# Patient Record
Sex: Female | Born: 1985 | Race: Black or African American | Hispanic: No | Marital: Single | Smoking: Former smoker
Health system: Southern US, Community
[De-identification: ages and names within clinical notes are randomized; demographics above are authoritative.]

## PROBLEM LIST (undated history)

## (undated) ENCOUNTER — Inpatient Hospital Stay (HOSPITAL_COMMUNITY): Payer: Self-pay

## (undated) DIAGNOSIS — K219 Gastro-esophageal reflux disease without esophagitis: Secondary | ICD-10-CM

## (undated) DIAGNOSIS — R87629 Unspecified abnormal cytological findings in specimens from vagina: Secondary | ICD-10-CM

## (undated) DIAGNOSIS — A5901 Trichomonal vulvovaginitis: Secondary | ICD-10-CM

## (undated) HISTORY — PX: WISDOM TOOTH EXTRACTION: SHX21

## (undated) HISTORY — PX: HERNIA REPAIR: SHX51

---

## 2012-04-04 DIAGNOSIS — A5901 Trichomonal vulvovaginitis: Secondary | ICD-10-CM

## 2012-04-04 HISTORY — DX: Trichomonal vulvovaginitis: A59.01

## 2013-12-31 ENCOUNTER — Inpatient Hospital Stay (HOSPITAL_COMMUNITY)
Admission: AD | Admit: 2013-12-31 | Discharge: 2013-12-31 | Disposition: A | Discharge status: Home / Self Care | Payer: Medicaid Other | Source: Ambulatory Visit | Attending: Family Medicine | Admitting: Family Medicine

## 2013-12-31 ENCOUNTER — Encounter (HOSPITAL_COMMUNITY): Payer: Self-pay | Admitting: *Deleted

## 2013-12-31 ENCOUNTER — Inpatient Hospital Stay (HOSPITAL_COMMUNITY): Payer: Self-pay

## 2013-12-31 DIAGNOSIS — O2390 Unspecified genitourinary tract infection in pregnancy, unspecified trimester: Secondary | ICD-10-CM | POA: Insufficient documentation

## 2013-12-31 DIAGNOSIS — O26899 Other specified pregnancy related conditions, unspecified trimester: Secondary | ICD-10-CM

## 2013-12-31 DIAGNOSIS — B373 Candidiasis of vulva and vagina: Secondary | ICD-10-CM

## 2013-12-31 DIAGNOSIS — R109 Unspecified abdominal pain: Secondary | ICD-10-CM | POA: Insufficient documentation

## 2013-12-31 DIAGNOSIS — B3731 Acute candidiasis of vulva and vagina: Secondary | ICD-10-CM

## 2013-12-31 DIAGNOSIS — Z87891 Personal history of nicotine dependence: Secondary | ICD-10-CM | POA: Insufficient documentation

## 2013-12-31 HISTORY — DX: Trichomonal vulvovaginitis: A59.01

## 2013-12-31 HISTORY — DX: Gastro-esophageal reflux disease without esophagitis: K21.9

## 2013-12-31 HISTORY — DX: Unspecified abnormal cytological findings in specimens from vagina: R87.629

## 2013-12-31 LAB — URINALYSIS, ROUTINE W REFLEX MICROSCOPIC
BILIRUBIN URINE: NEGATIVE
Glucose, UA: NEGATIVE mg/dL
Hgb urine dipstick: NEGATIVE
KETONES UR: NEGATIVE mg/dL
NITRITE: NEGATIVE
Protein, ur: NEGATIVE mg/dL
Specific Gravity, Urine: 1.01 (ref 1.005–1.030)
UROBILINOGEN UA: 0.2 mg/dL (ref 0.0–1.0)
pH: 6.5 (ref 5.0–8.0)

## 2013-12-31 LAB — CBC
HCT: 33.4 % — ABNORMAL LOW (ref 36.0–46.0)
Hemoglobin: 11.5 g/dL — ABNORMAL LOW (ref 12.0–15.0)
MCH: 29.1 pg (ref 26.0–34.0)
MCHC: 34.4 g/dL (ref 30.0–36.0)
MCV: 84.6 fL (ref 78.0–100.0)
PLATELETS: 201 10*3/uL (ref 150–400)
RBC: 3.95 MIL/uL (ref 3.87–5.11)
RDW: 13.6 % (ref 11.5–15.5)
WBC: 5.8 10*3/uL (ref 4.0–10.5)

## 2013-12-31 LAB — WET PREP, GENITAL
Trich, Wet Prep: NONE SEEN
Yeast Wet Prep HPF POC: NONE SEEN

## 2013-12-31 LAB — HIV ANTIBODY (ROUTINE TESTING W REFLEX): HIV: NONREACTIVE

## 2013-12-31 LAB — URINE MICROSCOPIC-ADD ON

## 2013-12-31 LAB — POCT PREGNANCY, URINE: Preg Test, Ur: POSITIVE — AB

## 2013-12-31 LAB — HCG, QUANTITATIVE, PREGNANCY: HCG, BETA CHAIN, QUANT, S: 76261 m[IU]/mL — AB (ref <5)

## 2013-12-31 MED ORDER — TERCONAZOLE 0.4 % VA CREA: 1.0000 | TOPICAL_CREAM | Freq: Every day | VAGINAL | Status: DC

## 2013-12-31 NOTE — MAU Provider Note (Signed)
Chief Complaint: Abdominal Pain   First Provider Initiated Contact with Patient 12/31/13 0224     SUBJECTIVE HPI: Bailey Martin is a 28 y.o. G3P1010 at [redacted]w[redacted]d by LMP who presents to maternity admissions reporting pain in the middle lower part of her abdomen radiating down into her vagina x 1 week.  She also reports vaginal discharge with itching x 1 week.  She is unsure of any exposure to STD as she was separated from her boyfriend for a few weeks but they got back together at the end of August.  She denies LOF, vaginal bleeding, urinary symptoms, h/a, dizziness, n/v, or fever/chills.     Past Medical History  Diagnosis Date  . Vaginal Pap smear, abnormal   . Trichomonal vaginitis 2014  . GERD (gastroesophageal reflux disease)    Past Surgical History  Procedure Laterality Date  . Hernia repair    . Wisdom tooth extraction     History   Social History  . Marital Status: Single    Spouse Name: N/A    Number of Children: N/A  . Years of Education: N/A   Occupational History  . Not on file.   Social History Main Topics  . Smoking status: Former Research scientist (life sciences)  . Smokeless tobacco: Never Used  . Alcohol Use: Yes     Comment: occassional when not pregnant  . Drug Use: No  . Sexual Activity: Yes    Birth Control/ Protection: None   Other Topics Concern  . Not on file   Social History Narrative  . No narrative on file   No current facility-administered medications on file prior to encounter.   No current outpatient prescriptions on file prior to encounter.   Allergies  Allergen Reactions  . Penicillins     Pt states that she has been allergic since childhood but does not know what the reaction is    ROS: Pertinent items in HPI  OBJECTIVE Blood pressure 105/70, pulse 72, temperature 99 F (37.2 C), temperature source Oral, resp. rate 18, height 5\' 8"  (1.727 m), weight 63.957 kg (141 lb), last menstrual period 11/08/2013, SpO2 100.00%. GENERAL: Well-developed,  well-nourished female in no acute distress.  HEENT: Normocephalic HEART: normal rate RESP: normal effort ABDOMEN: Soft, non-tender EXTREMITIES: Nontender, no edema NEURO: Alert and oriented Pelvic exam: Cervix pink, visually closed, without lesion, small amount thick white discharge, vaginal walls and external genitalia normal Bimanual exam: Cervix 0/long/high, firm, anterior, positive mild CMT, uterus nontender, 7 week size, adnexa without tenderness, enlargement, or mass  LAB RESULTS Results for orders placed during the hospital encounter of 12/31/13 (from the past 24 hour(s))  URINALYSIS, ROUTINE W REFLEX MICROSCOPIC     Status: Abnormal   Collection Time    12/31/13 12:33 AM      Result Value Ref Range   Color, Urine YELLOW  YELLOW   APPearance CLEAR  CLEAR   Specific Gravity, Urine 1.010  1.005 - 1.030   pH 6.5  5.0 - 8.0   Glucose, UA NEGATIVE  NEGATIVE mg/dL   Hgb urine dipstick NEGATIVE  NEGATIVE   Bilirubin Urine NEGATIVE  NEGATIVE   Ketones, ur NEGATIVE  NEGATIVE mg/dL   Protein, ur NEGATIVE  NEGATIVE mg/dL   Urobilinogen, UA 0.2  0.0 - 1.0 mg/dL   Nitrite NEGATIVE  NEGATIVE   Leukocytes, UA SMALL (*) NEGATIVE  URINE MICROSCOPIC-ADD ON     Status: None   Collection Time    12/31/13 12:33 AM      Result Value  Ref Range   Squamous Epithelial / LPF RARE  RARE   WBC, UA 3-6  <3 WBC/hpf   RBC / HPF 0-2  <3 RBC/hpf   Bacteria, UA RARE  RARE  CBC     Status: Abnormal   Collection Time    12/31/13  1:04 AM      Result Value Ref Range   WBC 5.8  4.0 - 10.5 K/uL   RBC 3.95  3.87 - 5.11 MIL/uL   Hemoglobin 11.5 (*) 12.0 - 15.0 g/dL   HCT 33.4 (*) 36.0 - 46.0 %   MCV 84.6  78.0 - 100.0 fL   MCH 29.1  26.0 - 34.0 pg   MCHC 34.4  30.0 - 36.0 g/dL   RDW 13.6  11.5 - 15.5 %   Platelets 201  150 - 400 K/uL  HCG, QUANTITATIVE, PREGNANCY     Status: Abnormal   Collection Time    12/31/13  1:04 AM      Result Value Ref Range   hCG, Beta Chain, [place], [place] [address] (*) <5 mIU/mL   POCT PREGNANCY, URINE     Status: Abnormal   Collection Time    12/31/13  1:36 AM      Result Value Ref Range   Preg Test, Ur POSITIVE (*) NEGATIVE  WET PREP, GENITAL     Status: Abnormal   Collection Time    12/31/13  2:40 AM      Result Value Ref Range   Yeast Wet Prep HPF POC NONE SEEN  NONE SEEN   Trich, Wet Prep NONE SEEN  NONE SEEN   Clue Cells Wet Prep HPF POC FEW (*) NONE SEEN   WBC, Wet Prep HPF POC FEW (*) NONE SEEN    IMAGING US Ob Comp Less 14 Wks  12/31/2013   CLINICAL DATA:  Abdominal pain for a few days. Estimated gestational age by LMP is 7 weeks 4 days. Quantitative beta HCG is 76,261.  EXAM: OBSTETRIC <14 WK [place] AND TRANSVAGINAL OB US  TECHNIQUE: Both transabdominal and transvaginal ultrasound examinations were performed for complete evaluation of the gestation as well as the maternal uterus, adnexal regions, and pelvic cul-de-sac. Transvaginal technique was performed to assess early pregnancy.  COMPARISON:  None.  FINDINGS: Intrauterine gestational sac: A single intrauterine pregnancy is demonstrated.  Yolk sac:  Yolk sac is present.  Embryo:  Fetal pole is identified.  Cardiac Activity: Fetal cardiac activity is identified.  Heart Rate:  171 bpm  CRL:   18.6  mm   8 w 3 d                  [place] EDC: 08/09/2014  Maternal uterus/adnexae: The uterus is anteverted. No myometrial masses. No subchorionic hemorrhage. Both ovaries are visualized and are normal in appearance. No abnormal adnexal masses. Minimal free pelvic fluid collections.  IMPRESSION: Single intrauterine pregnancy. Estimated gestational age by crown-rump length is 8 weeks 3 days. No acute complication is suggested.   Electronically Signed   By: Lucienne Capers M.D.   On: 12/31/2013 03:31   US Ob Transvaginal  12/31/2013   CLINICAL DATA:  Abdominal pain for a few days. Estimated gestational age by LMP is 7 weeks 4 days. Quantitative beta HCG is 76,261.  EXAM: OBSTETRIC <14 WK [place] AND TRANSVAGINAL OB US  TECHNIQUE: Both  transabdominal and transvaginal ultrasound examinations were performed for complete evaluation of the gestation as well as the maternal uterus, adnexal regions, and pelvic cul-de-sac. Transvaginal technique  was performed to assess early pregnancy.  COMPARISON:  None.  FINDINGS: Intrauterine gestational sac: A single intrauterine pregnancy is demonstrated.  Yolk sac:  Yolk sac is present.  Embryo:  Fetal pole is identified.  Cardiac Activity: Fetal cardiac activity is identified.  Heart Rate:  171 bpm  CRL:   18.6  mm   8 w 3 d                  [place] EDC: 08/09/2014  Maternal uterus/adnexae: The uterus is anteverted. No myometrial masses. No subchorionic hemorrhage. Both ovaries are visualized and are normal in appearance. No abnormal adnexal masses. Minimal free pelvic fluid collections.  IMPRESSION: Single intrauterine pregnancy. Estimated gestational age by crown-rump length is 8 weeks 3 days. No acute complication is suggested.   Electronically Signed   By: Lucienne Capers M.D.   On: 12/31/2013 03:31    ASSESSMENT 1. Abdominal pain in pregnancy   2. Yeast vaginitis     PLAN Discharge home Terazol 7 for yeast vaginitis symptoms F/u with early prenatal care    Medication List         terconazole 0.4 % vaginal cream  Commonly known as:  TERAZOL 7  Place 1 applicator vaginally at bedtime.       Follow-up Information   Please follow up. (With prenatal provider of your choice)       Follow up with [facility]. (As needed for emergencies)    Contact information:   [address] [id number] [place] [id number] [phone]      Fatima Blank Certified Nurse-Midwife 12/31/2013  4:18 AM

## 2013-12-31 NOTE — Discharge Instructions (Signed)

## 2013-12-31 NOTE — MAU Note (Signed)
Pt reports abd cramping for the last few days, positive preg test one week ago. Also thinks she may have a yeast infection.

## 2014-01-01 LAB — GC/CHLAMYDIA PROBE AMP
CT Probe RNA: NEGATIVE
GC PROBE AMP APTIMA: NEGATIVE

## 2014-01-01 LAB — URINE CULTURE
CULTURE: NO GROWTH
Colony Count: NO GROWTH

## 2014-01-01 NOTE — MAU Provider Note (Signed)
Attestation of Attending Supervision of Advanced Practitioner (PA/CNM/NP): Evaluation and management procedures were performed by the Advanced Practitioner under my supervision and collaboration.  I have reviewed the Advanced Practitioner's note and chart, and I agree with the management and plan.  Gelisa Tieken, DO Attending Physician Faculty Practice, [facility]  

## 2014-02-04 ENCOUNTER — Encounter (HOSPITAL_COMMUNITY): Payer: Self-pay | Admitting: *Deleted

## 2014-02-26 ENCOUNTER — Other Ambulatory Visit (HOSPITAL_COMMUNITY): Payer: Self-pay | Admitting: Urology

## 2014-02-26 DIAGNOSIS — Z3689 Encounter for other specified antenatal screening: Secondary | ICD-10-CM

## 2014-02-26 LAB — OB RESULTS CONSOLE ABO/RH: RH Type: POSITIVE

## 2014-02-26 LAB — OB RESULTS CONSOLE GC/CHLAMYDIA
CHLAMYDIA, DNA PROBE: NEGATIVE
Gonorrhea: NEGATIVE

## 2014-02-26 LAB — OB RESULTS CONSOLE HEPATITIS B SURFACE ANTIGEN: HEP B S AG: NEGATIVE

## 2014-02-26 LAB — OB RESULTS CONSOLE RUBELLA ANTIBODY, IGM: Rubella: IMMUNE

## 2014-02-26 LAB — OB RESULTS CONSOLE RPR: RPR: NONREACTIVE

## 2014-02-26 LAB — OB RESULTS CONSOLE ANTIBODY SCREEN: ANTIBODY SCREEN: NEGATIVE

## 2014-02-26 LAB — OB RESULTS CONSOLE HIV ANTIBODY (ROUTINE TESTING): HIV: NONREACTIVE

## 2014-03-18 ENCOUNTER — Ambulatory Visit (HOSPITAL_COMMUNITY)
Admission: RE | Admit: 2014-03-18 | Discharge: 2014-03-18 | Disposition: A | Discharge status: Home / Self Care | Payer: Medicaid Other | Source: Ambulatory Visit | Attending: Urology | Admitting: Urology

## 2014-03-18 DIAGNOSIS — Z36 Encounter for antenatal screening of mother: Secondary | ICD-10-CM | POA: Diagnosis present

## 2014-03-18 DIAGNOSIS — Z1389 Encounter for screening for other disorder: Secondary | ICD-10-CM | POA: Insufficient documentation

## 2014-03-18 DIAGNOSIS — Z3A19 19 weeks gestation of pregnancy: Secondary | ICD-10-CM | POA: Insufficient documentation

## 2014-03-18 DIAGNOSIS — Z3689 Encounter for other specified antenatal screening: Secondary | ICD-10-CM

## 2014-04-04 NOTE — L&D Delivery Note (Signed)
Delivery Note Progressed to complete dilation and AROM was performed for yellow mec fluid.  At 10:56 PM a viable and healthy female was delivered via Vaginal, Spontaneous Delivery (Presentation: OA ).  APGAR: , ; weight  .   Placenta status: spontaneous and grossly intact with 3 vessel Cord:  with the following complications: . Nuchal and body cord.  Anesthesia:  none Episiotomy:  none Lacerations:  None Suture Repair: none Est. Blood Loss (mL):    Mom to postpartum.  Baby to Couplet care / Skin to Skin.  [facility] 08/19/2014, 11:16 PM

## 2014-07-03 ENCOUNTER — Encounter: Payer: Self-pay | Admitting: *Deleted

## 2014-07-16 LAB — OB RESULTS CONSOLE GBS: STREP GROUP B AG: POSITIVE

## 2014-07-25 ENCOUNTER — Inpatient Hospital Stay (HOSPITAL_COMMUNITY)
Admission: AD | Admit: 2014-07-25 | Discharge: 2014-07-25 | Disposition: A | Discharge status: Home / Self Care | Payer: Medicaid Other | Source: Ambulatory Visit | Attending: Obstetrics and Gynecology | Admitting: Obstetrics and Gynecology

## 2014-07-25 ENCOUNTER — Encounter (HOSPITAL_COMMUNITY): Payer: Self-pay | Admitting: *Deleted

## 2014-07-25 DIAGNOSIS — O212 Late vomiting of pregnancy: Secondary | ICD-10-CM | POA: Insufficient documentation

## 2014-07-25 DIAGNOSIS — Z3A37 37 weeks gestation of pregnancy: Secondary | ICD-10-CM | POA: Diagnosis not present

## 2014-07-25 DIAGNOSIS — O26899 Other specified pregnancy related conditions, unspecified trimester: Secondary | ICD-10-CM

## 2014-07-25 DIAGNOSIS — Z87891 Personal history of nicotine dependence: Secondary | ICD-10-CM | POA: Insufficient documentation

## 2014-07-25 DIAGNOSIS — R109 Unspecified abdominal pain: Secondary | ICD-10-CM | POA: Insufficient documentation

## 2014-07-25 DIAGNOSIS — R112 Nausea with vomiting, unspecified: Secondary | ICD-10-CM

## 2014-07-25 DIAGNOSIS — O9989 Other specified diseases and conditions complicating pregnancy, childbirth and the puerperium: Secondary | ICD-10-CM | POA: Insufficient documentation

## 2014-07-25 LAB — URINALYSIS, ROUTINE W REFLEX MICROSCOPIC
Bilirubin Urine: NEGATIVE
Glucose, UA: NEGATIVE mg/dL
HGB URINE DIPSTICK: NEGATIVE
Ketones, ur: 15 mg/dL — AB
LEUKOCYTES UA: NEGATIVE
NITRITE: NEGATIVE
PROTEIN: NEGATIVE mg/dL
SPECIFIC GRAVITY, URINE: 1.02 (ref 1.005–1.030)
UROBILINOGEN UA: 0.2 mg/dL (ref 0.0–1.0)
pH: 8 (ref 5.0–8.0)

## 2014-07-25 MED ORDER — ONDANSETRON 8 MG PO TBDP
8.0000 mg | ORAL_TABLET | Freq: Once | ORAL | Status: AC
  Administered 2014-07-25: 8 mg via ORAL
  Filled 2014-07-25: qty 1

## 2014-07-25 MED ORDER — METOCLOPRAMIDE HCL 10 MG PO TABS
10.0000 mg | ORAL_TABLET | Freq: Three times a day (TID) | ORAL | Status: DC | PRN
  Administered 2014-07-25: 10 mg via ORAL
  Filled 2014-07-25: qty 1

## 2014-07-25 MED ORDER — ONDANSETRON 8 MG PO TBDP: 8.0000 mg | ORAL_TABLET | Freq: Once | ORAL | Status: DC

## 2014-07-25 MED ORDER — ONDANSETRON HCL 4 MG/2ML IJ SOLN
4.0000 mg | Freq: Once | INTRAMUSCULAR | Status: DC
  Filled 2014-07-25: qty 2

## 2014-07-25 MED ORDER — LACTATED RINGERS IV BOLUS (SEPSIS): 500.0000 mL | Freq: Once | INTRAVENOUS | Status: DC

## 2014-07-25 NOTE — Discharge Instructions (Signed)
Abdominal Pain During Pregnancy Belly (abdominal) pain is common during pregnancy. Most of the time, it is not a serious problem. Other times, it can be a sign that something is wrong with the pregnancy. Always tell your doctor if you have belly pain. HOME CARE Monitor your belly pain for any changes. The following actions may help you feel better:  Do not have sex (intercourse) or put anything in your vagina until you feel better.  Rest until your pain stops.  Drink clear fluids if you feel sick to your stomach (nauseous). Do not eat solid food until you feel better.  Only take medicine as told by your doctor.  Keep all doctor visits as told. GET HELP RIGHT AWAY IF:   You are bleeding, leaking fluid, or pieces of tissue come out of your vagina.  You have more pain or cramping.  You keep throwing up (vomiting).  You have pain when you pee (urinate) or have blood in your pee.  You have a fever.  You do not feel your baby moving as much.  You feel very weak or feel like passing out.  You have trouble breathing, with or without belly pain.  You have a very bad headache and belly pain.  You have fluid leaking from your vagina and belly pain.  You keep having watery poop (diarrhea).  Your belly pain does not go away after resting, or the pain gets worse. MAKE SURE YOU:   Understand these instructions.  Will watch your condition.  Will get help right away if you are not doing well or get worse. Document Released: 03/09/2009 Document Revised: 11/21/2012 Document Reviewed: 10/18/2012 [facility] Patient Information 2015 [place], [place]. This information is not intended to replace advice given to you by your health care provider. Make sure you discuss any questions you have with your health care provider.   Nausea and Vomiting Nausea means you feel sick to your stomach. Throwing up (vomiting) is a reflex where stomach contents come out of your mouth. HOME CARE   Take  medicine as told by your doctor.  Do not force yourself to eat. However, you do need to drink fluids.  If you feel like eating, eat a normal diet as told by your doctor.  Eat rice, wheat, potatoes, bread, lean meats, yogurt, fruits, and vegetables.  Avoid high-fat foods.  Drink enough fluids to keep your pee (urine) clear or pale yellow.  Ask your doctor how to replace body fluid losses (rehydrate). Signs of body fluid loss (dehydration) include:  Feeling very thirsty.  Dry lips and mouth.  Feeling dizzy.  Dark pee.  Peeing less than normal.  Feeling confused.  Fast breathing or heart rate. GET HELP RIGHT AWAY IF:   You have blood in your throw up.  You have black or bloody poop (stool).  You have a bad headache or stiff neck.  You feel confused.  You have bad belly (abdominal) pain.  You have chest pain or trouble breathing.  You do not pee at least once every 8 hours.  You have cold, clammy skin.  You keep throwing up after 24 to 48 hours.  You have a fever. MAKE SURE YOU:   Understand these instructions.  Will watch your condition.  Will get help right away if you are not doing well or get worse. Document Released: 09/07/2007 Document Revised: 06/13/2011 Document Reviewed: 08/20/2010 [facility] Patient Information 2015 [place], [place]. This information is not intended to replace advice given to you by your  health care provider. Make sure you discuss any questions you have with your health care provider. ° °

## 2014-07-25 NOTE — MAU Note (Signed)
BROUGHT  PT  TO RM 10  VIA  W/C-  SITING IN FLOOR IN LOBBY.

## 2014-07-25 NOTE — MAU Provider Note (Signed)
History     CSN: [id number]  Arrival date and time: 07/25/14 0053   None    Chief Complaint  Patient presents with  . Contractions   HPI  Patient is 29 y.o. G3P1010 [redacted]w[redacted]d here with complaints of abdominal cramping.  She states that she has had cramping since 12 am.  She started feeling nauseous since 1am. Vomited 2x here in MAU.  Drank an energy drink earlier this afternoon.  States that she feels well hydrated.  Endorses some decreased energy, nausea, vomiting, chills, normal PO intake.  Denies fevers, cough, CP, SOB, diarrhea, sick contacts, recent travel, petting zoos, dysuria, hematuria, urgency.  No trauma, no URI.  +FM, denies LOF, VB, vaginal discharge.  OB History    Gravida Para Term Preterm AB TAB SAB Ectopic Multiple Living   3 1 1  1 1           Past Medical History  Diagnosis Date  . Vaginal Pap smear, abnormal   . Trichomonal vaginitis 2014  . GERD (gastroesophageal reflux disease)     Past Surgical History  Procedure Laterality Date  . Hernia repair    . Wisdom tooth extraction      History reviewed. No pertinent family history.  History  Substance Use Topics  . Smoking status: Former Research scientist (life sciences)  . Smokeless tobacco: Never Used  . Alcohol Use: Yes     Comment: occassional when not pregnant    Allergies:  Allergies  Allergen Reactions  . Penicillins     Pt states that she has been allergic since childhood but does not know what the reaction is    Prescriptions prior to admission  Medication Sig Dispense Refill Last Dose  . terconazole (TERAZOL 7) 0.4 % vaginal cream Place 1 applicator vaginally at bedtime. 45 g 0     Review of Systems  Constitutional: Positive for chills and malaise/fatigue. Negative for fever.  Eyes: Negative for blurred vision, double vision and photophobia.  Respiratory: Negative for cough and wheezing.   Cardiovascular: Negative for chest pain and palpitations.  Gastrointestinal: Positive for nausea, vomiting and  abdominal pain. Negative for diarrhea.  Genitourinary: Negative for dysuria, urgency, frequency and hematuria.  Skin: Negative for rash.  Neurological: Negative for headaches.   Physical Exam   Blood pressure 109/71, pulse 74, temperature 98.2 F (36.8 C), temperature source Oral, resp. rate 20, last menstrual period 11/08/2013, SpO2 99 %.  Physical Exam  Constitutional: She appears well-developed and well-nourished. She appears distressed (uncomfortable in bed).  HENT:  Head: Normocephalic and atraumatic.  Eyes: EOM are normal. No scleral icterus.  Neck: Normal range of motion. Neck supple.  Cardiovascular: Normal rate, regular rhythm, normal heart sounds and intact distal pulses.   Respiratory: Effort normal and breath sounds normal. No respiratory distress. She has no wheezes.  GI: Soft. Bowel sounds are normal. There is tenderness (mild LUQ and epigastric TTP). There is no rebound and no guarding.  Gravid   Genitourinary: Vagina normal. No vaginal discharge found.  Cervix high and difficult to assess  Musculoskeletal: Normal range of motion. She exhibits no edema.  Neurological: She is alert.  Skin: Skin is warm and dry. No rash noted.   Fetal monitoringBaseline: 140 bpm, Variability: Good {> 6 bpm) and Accelerations: Reactive Uterine activity: uterine irritation  MAU Course  Procedures  MDM  NST reactive PO hydration> could not tolerate.  IV placement attempted several times without success.  Patient able to tolerate PO by this time, so therefore IVF were  not initiated.   Reglan 10mg  PO x1> patient vomited this back up> Zofran 8mg  ODT   After about 3 hours in MAU, patient was feeling better and able to tolerate water. Assessment and Plan  Patient initially here to evaluate cramping/Labor eval.  She then developed nausea and vomiting.  Health department OB records reviewed.  H&P unrevealing. Gastroenteritis possible but patient afebrile and without diarrhea at this point.  Considered splenic etiology.  Discomfort does NOT radiate to shoulder, no recent viral infections, no trauma and patient hemodynamically stable.  No peritoneal signs on exam.  Abdominal pain/ Nausea/vomiting in pregnancy. -Encourage PO hydration -Zofran ODT 8mg  PRN N/V -Return precautions discussed and patient voiced good understanding -Patient to f/u with HD as scheduled for routine care  Ronnie Doss M, DO 07/25/2014, 2:19 AM

## 2014-08-12 ENCOUNTER — Other Ambulatory Visit (HOSPITAL_COMMUNITY): Payer: Self-pay | Admitting: Nurse Practitioner

## 2014-08-12 DIAGNOSIS — O480 Post-term pregnancy: Secondary | ICD-10-CM

## 2014-08-13 ENCOUNTER — Ambulatory Visit (HOSPITAL_COMMUNITY)
Admission: RE | Admit: 2014-08-13 | Discharge: 2014-08-13 | Disposition: A | Discharge status: Home / Self Care | Payer: Medicaid Other | Source: Ambulatory Visit | Attending: Nurse Practitioner | Admitting: Nurse Practitioner

## 2014-08-13 DIAGNOSIS — Z3A40 40 weeks gestation of pregnancy: Secondary | ICD-10-CM | POA: Diagnosis not present

## 2014-08-13 DIAGNOSIS — O480 Post-term pregnancy: Secondary | ICD-10-CM | POA: Insufficient documentation

## 2014-08-15 ENCOUNTER — Encounter (HOSPITAL_COMMUNITY): Payer: Self-pay | Admitting: *Deleted

## 2014-08-15 ENCOUNTER — Telehealth (HOSPITAL_COMMUNITY): Payer: Self-pay | Admitting: *Deleted

## 2014-08-15 NOTE — Telephone Encounter (Signed)
Preadmission screen  

## 2014-08-18 ENCOUNTER — Encounter (HOSPITAL_COMMUNITY): Payer: Self-pay | Admitting: *Deleted

## 2014-08-18 ENCOUNTER — Inpatient Hospital Stay (EMERGENCY_DEPARTMENT_HOSPITAL)
Admission: AD | Admit: 2014-08-18 | Discharge: 2014-08-18 | Disposition: A | Discharge status: Home / Self Care | Payer: Medicaid Other | Source: Ambulatory Visit | Attending: Family Medicine | Admitting: Family Medicine

## 2014-08-18 DIAGNOSIS — O480 Post-term pregnancy: Secondary | ICD-10-CM

## 2014-08-18 DIAGNOSIS — Z3A41 41 weeks gestation of pregnancy: Secondary | ICD-10-CM | POA: Diagnosis not present

## 2014-08-18 DIAGNOSIS — O99824 Streptococcus B carrier state complicating childbirth: Secondary | ICD-10-CM | POA: Diagnosis not present

## 2014-08-18 DIAGNOSIS — Z3689 Encounter for other specified antenatal screening: Secondary | ICD-10-CM

## 2014-08-18 DIAGNOSIS — Z36 Encounter for antenatal screening of mother: Secondary | ICD-10-CM

## 2014-08-18 NOTE — MAU Note (Signed)
Pt had nonreative NST in office here to repeat

## 2014-08-18 NOTE — MAU Provider Note (Signed)
  History     CSN: [id number]  Arrival date and time: 08/18/14 0954   None     Chief Complaint  Patient presents with  . Non-stress Test   HPI    Bailey Martin is a 29 y.o. female G3P0011 at [redacted]w[redacted]d who is here for an NST. She was seen today at the health department and had a non-reactive fetal tracing. She has not had anything to eat this morning.  She is feeling her baby move, and says she is very active.   She denies bleeding or leaking of fluid She is scheduled for induction tomorrow.   OB History    Gravida Para Term Preterm AB TAB SAB Ectopic Multiple Living   3 0 0  1 1    1       Past Medical History  Diagnosis Date  . Vaginal Pap smear, abnormal   . Trichomonal vaginitis 2014  . GERD (gastroesophageal reflux disease)     Past Surgical History  Procedure Laterality Date  . Hernia repair    . Wisdom tooth extraction      History reviewed. No pertinent family history.  History  Substance Use Topics  . Smoking status: Former Research scientist (life sciences)  . Smokeless tobacco: Never Used  . Alcohol Use: Yes     Comment: occassional when not pregnant    Allergies:  Allergies  Allergen Reactions  . Latex Itching and Swelling  . Penicillins     Pt states that she has been allergic since childhood but does not know what the reaction is    Prescriptions prior to admission  Medication Sig Dispense Refill Last Dose  . Prenatal Vit-Fe Fumarate-FA (PRENATAL MULTIVITAMIN) TABS tablet Take 1 tablet by mouth daily at 12 noon.   08/17/2014 at Unknown time  . terconazole (TERAZOL 7) 0.4 % vaginal cream Place 1 applicator vaginally at bedtime. 45 g 0 08/17/2014 at Unknown time  . ondansetron (ZOFRAN-ODT) 8 MG disintegrating tablet Take 1 tablet (8 mg total) by mouth once. (Patient not taking: Reported on 08/18/2014) 20 tablet 0    No results found for this or any previous visit (from the past 48 hour(s)).  Review of Systems  Constitutional: Negative for fever and chills.  Eyes:  Negative for blurred vision.  Gastrointestinal: Negative for abdominal pain.  Neurological: Negative for headaches.   Physical Exam   Blood pressure 106/61, pulse 79, temperature 98.2 F (36.8 C), temperature source Oral, resp. rate 16, height 5\' 8"  (1.727 m), weight 83.008 kg (183 lb), last menstrual period 11/08/2013.  Physical Exam  Constitutional: She is oriented to person, place, and time. She appears well-developed and well-nourished. No distress.  HENT:  Head: Normocephalic.  Eyes: Pupils are equal, round, and reactive to light.  Neck: Neck supple.  Respiratory: Effort normal.  Musculoskeletal: Normal range of motion.  Neurological: She is alert and oriented to person, place, and time.  Skin: Skin is warm. She is not diaphoretic.  Psychiatric: Her behavior is normal.   Fetal Tracing: Baseline: 130 bpm Variability: Moderate  Accelerations: 15x15 Decelerations: none Toco: UI    MAU Course  Procedures  None  MDM   Assessment and Plan   A:  1. NST (non-stress test) reactive   2. Post term pregnancy, 41 weeks    P:  Discharge home in stable condition Labor precautions Kick counts Return to MAU if symptoms worsen  Lezlie Lye, NP 08/18/2014 5:16 PM

## 2014-08-19 ENCOUNTER — Inpatient Hospital Stay (HOSPITAL_COMMUNITY)
Admission: RE | Admit: 2014-08-19 | Discharge: 2014-08-21 | DRG: 775 | Disposition: A | Discharge status: Home / Self Care | Payer: Medicaid Other | Source: Ambulatory Visit | Attending: Family Medicine | Admitting: Family Medicine

## 2014-08-19 ENCOUNTER — Encounter (HOSPITAL_COMMUNITY): Payer: Self-pay

## 2014-08-19 DIAGNOSIS — Z3A41 41 weeks gestation of pregnancy: Secondary | ICD-10-CM | POA: Diagnosis present

## 2014-08-19 DIAGNOSIS — O480 Post-term pregnancy: Secondary | ICD-10-CM | POA: Diagnosis present

## 2014-08-19 DIAGNOSIS — O99824 Streptococcus B carrier state complicating childbirth: Secondary | ICD-10-CM | POA: Diagnosis present

## 2014-08-19 DIAGNOSIS — Z3483 Encounter for supervision of other normal pregnancy, third trimester: Secondary | ICD-10-CM | POA: Diagnosis present

## 2014-08-19 DIAGNOSIS — Z36 Encounter for antenatal screening of mother: Secondary | ICD-10-CM | POA: Diagnosis not present

## 2014-08-19 DIAGNOSIS — IMO0001 Reserved for inherently not codable concepts without codable children: Secondary | ICD-10-CM

## 2014-08-19 DIAGNOSIS — O481 Prolonged pregnancy: Secondary | ICD-10-CM | POA: Diagnosis present

## 2014-08-19 LAB — CBC
HEMATOCRIT: 33.3 % — AB (ref 36.0–46.0)
Hemoglobin: 11.2 g/dL — ABNORMAL LOW (ref 12.0–15.0)
MCH: 29.2 pg (ref 26.0–34.0)
MCHC: 33.6 g/dL (ref 30.0–36.0)
MCV: 86.7 fL (ref 78.0–100.0)
Platelets: 177 10*3/uL (ref 150–400)
RBC: 3.84 MIL/uL — ABNORMAL LOW (ref 3.87–5.11)
RDW: 14.4 % (ref 11.5–15.5)
WBC: 7.8 10*3/uL (ref 4.0–10.5)

## 2014-08-19 LAB — TYPE AND SCREEN
ABO/RH(D): O POS
ANTIBODY SCREEN: NEGATIVE

## 2014-08-19 LAB — ABO/RH: ABO/RH(D): O POS

## 2014-08-19 LAB — RPR: RPR Ser Ql: NONREACTIVE

## 2014-08-19 MED ORDER — OXYTOCIN 40 UNITS IN LACTATED RINGERS INFUSION - SIMPLE MED: 62.5000 mL/h | INTRAVENOUS | Status: DC

## 2014-08-19 MED ORDER — OXYTOCIN 40 UNITS IN LACTATED RINGERS INFUSION - SIMPLE MED
1.0000 m[IU]/min | INTRAVENOUS | Status: DC
  Administered 2014-08-19: 2 m[IU]/min via INTRAVENOUS
  Administered 2014-08-19: 11 m[IU]/min via INTRAVENOUS
  Filled 2014-08-19: qty 1000

## 2014-08-19 MED ORDER — BUTORPHANOL TARTRATE 1 MG/ML IJ SOLN
1.0000 mg | INTRAMUSCULAR | Status: DC | PRN
  Administered 2014-08-19: 1 mg via INTRAVENOUS
  Filled 2014-08-19: qty 1

## 2014-08-19 MED ORDER — CITRIC ACID-SODIUM CITRATE 334-500 MG/5ML PO SOLN: 30.0000 mL | ORAL | Status: DC | PRN

## 2014-08-19 MED ORDER — OXYCODONE-ACETAMINOPHEN 5-325 MG PO TABS: 2.0000 | ORAL_TABLET | ORAL | Status: DC | PRN

## 2014-08-19 MED ORDER — BUTORPHANOL TARTRATE 1 MG/ML IJ SOLN
1.0000 mg | INTRAMUSCULAR | Status: DC | PRN
Start: 2014-08-19 — End: 2014-08-20
  Administered 2014-08-19: 1 mg via INTRAMUSCULAR
  Filled 2014-08-19: qty 1

## 2014-08-19 MED ORDER — ACETAMINOPHEN 325 MG PO TABS: 650.0000 mg | ORAL_TABLET | ORAL | Status: DC | PRN

## 2014-08-19 MED ORDER — LACTATED RINGERS IV SOLN
INTRAVENOUS | Status: DC
  Administered 2014-08-19: 08:00:00 via INTRAVENOUS

## 2014-08-19 MED ORDER — PROMETHAZINE HCL 25 MG/ML IJ SOLN
25.0000 mg | Freq: Four times a day (QID) | INTRAMUSCULAR | Status: DC | PRN
  Administered 2014-08-19: 25 mg via INTRAMUSCULAR
  Filled 2014-08-19: qty 1

## 2014-08-19 MED ORDER — MISOPROSTOL 200 MCG PO TABS
50.0000 ug | ORAL_TABLET | ORAL | Status: DC | PRN
  Administered 2014-08-19: 50 ug via ORAL
  Filled 2014-08-19: qty 0.5

## 2014-08-19 MED ORDER — TERBUTALINE SULFATE 1 MG/ML IJ SOLN: 0.2500 mg | Freq: Once | INTRAMUSCULAR | Status: AC | PRN

## 2014-08-19 MED ORDER — LACTATED RINGERS IV SOLN
INTRAVENOUS | Status: DC
  Administered 2014-08-19: 16:00:00 via INTRAVENOUS

## 2014-08-19 MED ORDER — OXYTOCIN BOLUS FROM INFUSION
500.0000 mL | INTRAVENOUS | Status: DC
  Administered 2014-08-19: 500 mL via INTRAVENOUS

## 2014-08-19 MED ORDER — ONDANSETRON HCL 4 MG/2ML IJ SOLN
4.0000 mg | Freq: Four times a day (QID) | INTRAMUSCULAR | Status: DC | PRN
  Administered 2014-08-19 (×2): 4 mg via INTRAVENOUS
  Filled 2014-08-19 (×2): qty 2

## 2014-08-19 MED ORDER — CEFAZOLIN SODIUM-DEXTROSE 2-3 GM-% IV SOLR
2.0000 g | Freq: Once | INTRAVENOUS | Status: AC
  Administered 2014-08-19: 2 g via INTRAVENOUS
  Filled 2014-08-19: qty 50

## 2014-08-19 MED ORDER — FENTANYL CITRATE (PF) 100 MCG/2ML IJ SOLN
100.0000 ug | INTRAMUSCULAR | Status: DC | PRN
  Administered 2014-08-19 (×2): 100 ug via INTRAVENOUS
  Filled 2014-08-19 (×2): qty 2

## 2014-08-19 MED ORDER — LACTATED RINGERS IV SOLN: 500.0000 mL | INTRAVENOUS | Status: DC | PRN

## 2014-08-19 MED ORDER — OXYCODONE-ACETAMINOPHEN 5-325 MG PO TABS: 1.0000 | ORAL_TABLET | ORAL | Status: DC | PRN

## 2014-08-19 MED ORDER — LIDOCAINE HCL (PF) 1 % IJ SOLN
30.0000 mL | INTRAMUSCULAR | Status: DC | PRN
  Filled 2014-08-19: qty 30

## 2014-08-19 MED ORDER — CEFAZOLIN SODIUM 1-5 GM-% IV SOLN
1.0000 g | Freq: Three times a day (TID) | INTRAVENOUS | Status: DC
  Filled 2014-08-19 (×3): qty 50

## 2014-08-19 NOTE — Progress Notes (Signed)
Labor Progress Note  S: comfortable  O:  BP 114/57 mmHg  Pulse 84  Temp(Src) 98.7 F (37.1 C) (Oral)  Resp 18  Ht 5\' 8"  (1.727 m)  Wt 183 lb (83.008 kg)  BMI 27.83 kg/m2  LMP 11/08/2013 Cat I CVE: 4/50/-3 S/p cytotec x 1, FB out this exam  A&P: 28 y.o. G3P0011 [redacted]w[redacted]d  IOL 2/2 prolonged pregnancy # labor: start pitocin.  No cytotec as had a deceleration x 1 few hours ago with cytotec  Merla Riches, MD 4:12 PM

## 2014-08-19 NOTE — H&P (Signed)
LABOR ADMISSION HISTORY AND PHYSICAL  Ashni Lonzo is a 29 y.o. female G3P0011 with IUP at [redacted]w[redacted]d by [place] at [redacted]w[redacted]d presenting for IOL 2/2 prolonged pregnancy. She reports +FMs, No LOF, no VB, no blurry vision, headaches or peripheral edema, and RUQ pain.  She plans on breast feeding. She request BTL for birth control.  Dating: By redating CRL at [redacted]w[redacted]d --->  Estimated Date of Delivery: 08/09/14    Prenatal History/Complications:  Past Medical History: Past Medical History  Diagnosis Date  . Vaginal Pap smear, abnormal   . Trichomonal vaginitis 2014  . GERD (gastroesophageal reflux disease)     Past Surgical History: Past Surgical History  Procedure Laterality Date  . Hernia repair    . Wisdom tooth extraction      Obstetrical History: OB History    Gravida Para Term Preterm AB TAB SAB Ectopic Multiple Living   3 0 0  1 1    1       Social History: History   Social History  . Marital Status: Single    Spouse Name: N/A  . Number of Children: N/A  . Years of Education: N/A   Social History Main Topics  . Smoking status: Former Research scientist (life sciences)  . Smokeless tobacco: Never Used  . Alcohol Use: Yes     Comment: occassional when not pregnant  . Drug Use: No  . Sexual Activity: Yes    Birth Control/ Protection: None   Other Topics Concern  . None   Social History Narrative    Family History: Family History  Problem Relation Age of Onset  . Diabetes Maternal Grandmother     Allergies: Allergies  Allergen Reactions  . Latex Itching and Swelling  . Penicillins     Pt states that she has been allergic since childhood but does not know what the reaction is    Prescriptions prior to admission  Medication Sig Dispense Refill Last Dose  . Prenatal Vit-Fe Fumarate-FA (PRENATAL MULTIVITAMIN) TABS tablet Take 1 tablet by mouth daily at 12 noon.   08/19/2014 at Unknown time  . terconazole (TERAZOL 7) 0.4 % vaginal cream Place 1 applicator vaginally at bedtime. 45 g 0  08/18/2014 at Unknown time     Review of Systems   All systems reviewed and negative except as stated in HPI  Blood pressure 116/63, pulse 98, temperature 98.9 F (37.2 C), temperature source Oral, resp. rate 18, height 5\' 8"  (1.727 m), weight 183 lb (83.008 kg), last menstrual period 11/08/2013. General appearance: alert and cooperative Lungs: clear to auscultation bilaterally Heart: regular rate and rhythm Abdomen: soft, non-tender; bowel sounds normal Pelvic: adequate Extremities: Homans sign is negative, no sign of DVT DTR's 2+ Presentation: cephalic, confirmed with sono given very difficult exam     Prenatal labs: ABO, Rh: O/Positive/-- (11/25 0000) Antibody: Negative (11/25 0000) Rubella:   RPR: Nonreactive (11/25 0000)  HBsAg: Negative (11/25 0000)  HIV: Non-reactive (11/25 0000)  GBS: Positive (04/13 0000)  1 hr Glucola 87>79 Genetic screening  negative Anatomy US normal  Prenatal Transfer Tool  Maternal Diabetes: No Genetic Screening: Normal Maternal Ultrasounds/Referrals: Normal Fetal Ultrasounds or other Referrals:  None Maternal Substance Abuse:  No Significant Maternal Medications:  None Significant Maternal Lab Results: Lab values include: Group B Strep positive  Results for orders placed or performed during the hospital encounter of 08/19/14 (from the past 24 hour(s))  CBC   Collection Time: 08/19/14  8:10 AM  Result Value Ref Range   WBC 7.8 4.0 -  10.5 K/uL   RBC 3.84 (L) 3.87 - 5.11 MIL/uL   Hemoglobin 11.2 (L) 12.0 - 15.0 g/dL   HCT 33.3 (L) 36.0 - 46.0 %   MCV 86.7 78.0 - 100.0 fL   MCH 29.2 26.0 - 34.0 pg   MCHC 33.6 30.0 - 36.0 g/dL   RDW 14.4 11.5 - 15.5 %   Platelets 177 150 - 400 K/uL    Patient Active Problem List   Diagnosis Date Noted  . Prolonged pregnancy 08/19/2014  . Post-term pregnancy, 40-42 weeks of gestation   . [redacted] weeks gestation of pregnancy   . [redacted] weeks gestation of pregnancy   . Encounter for routine screening for  malformation using ultrasonics     Assessment: Chene Kasinger is a 29 y.o. G3P0011 at [redacted]w[redacted]d here for IOL 2/2 prolonged pregnancy  #Labor: FB and cytotec for IOL #Pain: Fentanyl 129mcg prn pain #FWB: Cat I #ID:  GBS + => PCN once FB out #MOF: breast #MOC:BTL, consents signed 06/26/2014   Daiden Coltrane ROCIO 08/19/2014, 9:14 AM

## 2014-08-19 NOTE — Progress Notes (Signed)
Patient ID: Bailey Martin, female   DOB: 11/13/1985, 29 y.o.   MRN: [id number] Doing well  Filed Vitals:   08/19/14 1955 08/19/14 2045 08/19/14 2049 08/19/14 2142  BP: 114/58 119/64 118/58 130/60  Pulse: 62 91 74 67  Temp:      TempSrc:      Resp: 18 18 18 18   Height:      Weight:       FHR reassuring with early decels UCs every 2-3 min  Dilation: 4.5 Effacement (%): 50 Cervical Position: Posterior Station: -3 Presentation: Vertex Exam by:: brewer RNC   Will continue plan of care

## 2014-08-20 ENCOUNTER — Encounter (HOSPITAL_COMMUNITY): Payer: Self-pay | Admitting: Anesthesiology

## 2014-08-20 ENCOUNTER — Encounter (HOSPITAL_COMMUNITY): Payer: Self-pay

## 2014-08-20 MED ORDER — IBUPROFEN 600 MG PO TABS
600.0000 mg | ORAL_TABLET | Freq: Four times a day (QID) | ORAL | Status: DC
  Administered 2014-08-20 – 2014-08-21 (×5): 600 mg via ORAL
  Filled 2014-08-20 (×7): qty 1

## 2014-08-20 MED ORDER — TETANUS-DIPHTH-ACELL PERTUSSIS 5-2.5-18.5 LF-MCG/0.5 IM SUSP: 0.5000 mL | Freq: Once | INTRAMUSCULAR | Status: DC

## 2014-08-20 MED ORDER — OXYCODONE-ACETAMINOPHEN 5-325 MG PO TABS: 2.0000 | ORAL_TABLET | ORAL | Status: DC | PRN

## 2014-08-20 MED ORDER — ONDANSETRON HCL 4 MG/2ML IJ SOLN: 4.0000 mg | INTRAMUSCULAR | Status: DC | PRN

## 2014-08-20 MED ORDER — SENNOSIDES-DOCUSATE SODIUM 8.6-50 MG PO TABS
2.0000 | ORAL_TABLET | ORAL | Status: DC
  Administered 2014-08-20: 2 via ORAL
  Filled 2014-08-20 (×2): qty 2

## 2014-08-20 MED ORDER — WITCH HAZEL-GLYCERIN EX PADS: 1.0000 "application " | MEDICATED_PAD | CUTANEOUS | Status: DC | PRN

## 2014-08-20 MED ORDER — ACETAMINOPHEN 325 MG PO TABS
650.0000 mg | ORAL_TABLET | ORAL | Status: DC | PRN
  Administered 2014-08-20 (×2): 650 mg via ORAL
  Filled 2014-08-20 (×2): qty 2

## 2014-08-20 MED ORDER — DIPHENHYDRAMINE HCL 25 MG PO CAPS: 25.0000 mg | ORAL_CAPSULE | Freq: Four times a day (QID) | ORAL | Status: DC | PRN

## 2014-08-20 MED ORDER — ZOLPIDEM TARTRATE 5 MG PO TABS: 5.0000 mg | ORAL_TABLET | Freq: Every evening | ORAL | Status: DC | PRN

## 2014-08-20 MED ORDER — ONDANSETRON HCL 4 MG PO TABS: 4.0000 mg | ORAL_TABLET | ORAL | Status: DC | PRN

## 2014-08-20 MED ORDER — LANOLIN HYDROUS EX OINT: TOPICAL_OINTMENT | CUTANEOUS | Status: DC | PRN

## 2014-08-20 MED ORDER — SIMETHICONE 80 MG PO CHEW: 80.0000 mg | CHEWABLE_TABLET | ORAL | Status: DC | PRN

## 2014-08-20 MED ORDER — BENZOCAINE-MENTHOL 20-0.5 % EX AERO: 1.0000 "application " | INHALATION_SPRAY | CUTANEOUS | Status: DC | PRN

## 2014-08-20 MED ORDER — DIBUCAINE 1 % RE OINT: 1.0000 "application " | TOPICAL_OINTMENT | RECTAL | Status: DC | PRN

## 2014-08-20 MED ORDER — OXYCODONE-ACETAMINOPHEN 5-325 MG PO TABS: 1.0000 | ORAL_TABLET | ORAL | Status: DC | PRN

## 2014-08-20 MED ORDER — PRENATAL MULTIVITAMIN CH
1.0000 | ORAL_TABLET | Freq: Every day | ORAL | Status: DC
  Administered 2014-08-20 – 2014-08-21 (×2): 1 via ORAL
  Filled 2014-08-20 (×2): qty 1

## 2014-08-20 NOTE — Progress Notes (Signed)
Post Partum Day 1 Subjective: no complaints, up ad lib, voiding and tolerating PO  Objective: Blood pressure 100/60, pulse 70, temperature 98.4 F (36.9 C), temperature source Oral, resp. rate 20, height 5\' 8"  (1.727 m), weight 183 lb (83.008 kg), last menstrual period 11/08/2013, unknown if currently breastfeeding.  Physical Exam:  General: alert, cooperative and no distress Lochia: appropriate Uterine Fundus: firm Incision: healing well DVT Evaluation: No evidence of DVT seen on physical exam.   Recent Labs  08/19/14 0810  HGB 11.2*  HCT 33.3*    Assessment/Plan: Plan for discharge tomorrow and Breastfeeding   LOS: 1 day   [facility] 08/20/2014, 6:47 AM

## 2014-08-20 NOTE — Lactation Note (Signed)
This note was copied from the chart of Bailey Martin. Lactation Consultation Note ; Experienced BF mother- latching baby to breast when I went into room. Reports she has been feeding for 20 min on the other breast. Assisted mom- encouraged to wait for wide open mouth and keep the baby close throughout the feeding. No questions at present. To call for assist prn. BF brochure given- reviewed BFSG and OP appointments as resources for support after DC.  Patient Name: Bailey Martin [id number] Date: 08/20/2014 Reason for consult: Initial assessment   Maternal Data Formula Feeding for Exclusion: No Does the patient have breastfeeding experience prior to this delivery?: Yes  Feeding Feeding Type: Breast Fed Length of feed: 20 min  LATCH Score/Interventions Latch: Grasps breast easily, tongue down, lips flanged, rhythmical sucking.  Audible Swallowing: A few with stimulation  Type of Nipple: Everted at rest and after stimulation  Comfort (Breast/Nipple): Soft / non-tender     Hold (Positioning): Assistance needed to correctly position infant at breast and maintain latch. Intervention(s): Breastfeeding basics reviewed;Skin to skin  LATCH Score: 8  Lactation Tools Discussed/Used     Consult Status Consult Status: PRN    Truddie Crumble 08/20/2014, 1:20 PM

## 2014-08-21 ENCOUNTER — Encounter (HOSPITAL_COMMUNITY)
Admission: RE | Disposition: A | Discharge status: Home / Self Care | Payer: Self-pay | Source: Ambulatory Visit | Attending: Family Medicine

## 2014-08-21 SURGERY — LIGATION, FALLOPIAN TUBE, POSTPARTUM
Anesthesia: Choice | Laterality: Bilateral

## 2014-08-21 MED ORDER — IBUPROFEN 600 MG PO TABS: 600.0000 mg | ORAL_TABLET | Freq: Four times a day (QID) | ORAL | Status: DC | PRN

## 2014-08-21 NOTE — Progress Notes (Signed)
Per pt- has changed her mind and does not wish to have tubal ligation done anymore.

## 2014-08-21 NOTE — Discharge Instructions (Signed)

## 2014-08-21 NOTE — Lactation Note (Signed)
This note was copied from the chart of Bailey Martin. Lactation Consultation Note  Patient Name: Bailey Martin [id number] Date: 08/21/2014 Reason for consult: Follow-up assessment;Breast/nipple pain  Visited with Mom on day of discharge, baby 6 hrs old.  Mom lying down on her side, and baby is latched onto nipple shield.  Mom was given a 24 mm nipple shield due to soreness, small crack on base of nipple. Colostrum on nipple recommended, along with Comfort Gels (given by RN)  Talked about importance of supporting breast firmly, and making sure baby is not just latched onto only nipple. Colostrum noted in shield after baby taken off.  Repositioned baby in football hold without use of nipple shield.  Lots of teaching done to maximize the depth on the breast.  Showed Mom how to un-tuck lower lip.  Mom states the latch felt much better.  Breasts are filling and encouraged her to keep baby skin to skin, and feed her often.  Mom has a manual pump for home.  Talked about engorgement prevention and treatment.  Informed Mom of OP lactation services available.  Encouraged to call prn.    Consult Status Consult Status: Complete Date: 08/21/14 Follow-up type: Call as needed    Broadus John 08/21/2014, 12:29 PM

## 2014-08-21 NOTE — Discharge Summary (Signed)
Obstetric Discharge Summary Reason for Admission: induction of labor for postterm preg Prenatal Procedures: ultrasound Intrapartum Procedures: spontaneous vaginal delivery and GBS prophylaxis Postpartum Procedures: none Complications-Operative and Postpartum: none  Progressed to complete dilation and AROM was performed for yellow mec fluid. At 10:56 PM a viable and healthy female was delivered via Vaginal, Spontaneous Delivery (Presentation: OA ). APGAR: , ; weight .  Placenta status: spontaneous and grossly intact with 3 vessel Cord: with the following complications: . Nuchal and body cord.  Anesthesia: none Episiotomy: none Lacerations: None Suture Repair: none Est. Blood Loss (mL):   Hospital Course:  Active Problems:   Prolonged pregnancy   Status post vaginal delivery   Meconium in amniotic fluid affecting management of mother, delivered   Bailey Martin is a 29 y.o. [id number] s/p SVD after IOL for postterm preg.  Patient was admitted 08/19/14.  She has postpartum course that was uncomplicated. The pt feels ready to go home and  will be discharged with outpatient follow-up.   Today: No acute events overnight.  Pt denies problems with ambulating, voiding or po intake.  She denies nausea or vomiting.  Pain is moderately controlled.  She has had flatus. She has had bowel movement.  Lochia Moderate.  Plan for birth control is  Nexplanon/undecided. She was initially interested in [place] and had signed papers, but then changed her mind due to the permanency of it.  Method of Feeding: breastfeeding  Physical Exam:  General: alert, cooperative, appears stated age and no distress Lochia: appropriate Uterine Fundus: firm, 2 fingers below umbilicus DVT Evaluation: No evidence of DVT seen on physical exam.  H/H: Lab Results  Component Value Date/Time   HGB 11.2* 08/19/2014 08:10 AM   HCT 33.3* 08/19/2014 08:10 AM    Discharge Diagnoses: Term  Pregnancy-delivered  Discharge Information: Date: 08/21/2014 Activity: pelvic rest Diet: routine  Medications: PNV and Ibuprofen Breast feeding:  Yes Condition: stable Instructions: refer to handout Discharge to: home      Medication List    STOP taking these medications        terconazole 0.4 % vaginal cream  Commonly known as:  TERAZOL 7      TAKE these medications        ibuprofen 600 MG tablet  Commonly known as:  ADVIL,MOTRIN  Take 1 tablet (600 mg total) by mouth every 6 (six) hours as needed.     prenatal multivitamin Tabs tablet  Take 1 tablet by mouth daily at 12 noon.           Follow-up Information    Follow up with [facility] [place]. Schedule an appointment as soon as possible for a visit in 4 weeks.   Why:  For your postpartum appointment.   Contact information:   [place] [phone]     CNM attestation   Bailey Martin is a 29 y.o. [phone] s/p IOL for postterm preg- SVD.   Pain is well controlled.  Plan for birth control is now Nexplanon after she had changed her mind re BTL.  Method of Feeding: breast  PE:  BP 121/63 mmHg  Pulse 90  Temp(Src) 98.1 F (36.7 C) (Oral)  Resp 20  Ht 5\' 8"  (1.727 m)  Wt 83.008 kg (183 lb)  BMI 27.83 kg/m2  SpO2 100%  LMP 11/08/2013  Breastfeeding? Unknown Fundus firm   Recent Labs  08/19/14 0810  HGB 11.2*  HCT 33.3*     Plan: discharge today -  postpartum care discussed - f/u clinic in 6 weeks for postpartum visit   SHAW, Joelene Millin, CNM 9:31 AM

## 2017-09-02 ENCOUNTER — Inpatient Hospital Stay (HOSPITAL_COMMUNITY): Payer: Medicaid Other

## 2017-09-02 ENCOUNTER — Encounter (HOSPITAL_COMMUNITY): Payer: Self-pay | Admitting: *Deleted

## 2017-09-02 ENCOUNTER — Inpatient Hospital Stay (HOSPITAL_COMMUNITY)
Admission: AD | Admit: 2017-09-02 | Discharge: 2017-09-02 | Disposition: A | Discharge status: Home / Self Care | Payer: Medicaid Other | Source: Ambulatory Visit | Attending: Obstetrics and Gynecology | Admitting: Obstetrics and Gynecology

## 2017-09-02 DIAGNOSIS — O209 Hemorrhage in early pregnancy, unspecified: Secondary | ICD-10-CM | POA: Diagnosis present

## 2017-09-02 DIAGNOSIS — Z3A01 Less than 8 weeks gestation of pregnancy: Secondary | ICD-10-CM | POA: Insufficient documentation

## 2017-09-02 DIAGNOSIS — Z87891 Personal history of nicotine dependence: Secondary | ICD-10-CM | POA: Diagnosis not present

## 2017-09-02 DIAGNOSIS — R109 Unspecified abdominal pain: Secondary | ICD-10-CM | POA: Diagnosis present

## 2017-09-02 DIAGNOSIS — O26891 Other specified pregnancy related conditions, first trimester: Secondary | ICD-10-CM | POA: Diagnosis present

## 2017-09-02 LAB — HCG, QUANTITATIVE, PREGNANCY: hCG, Beta Chain, Quant, S: 1000 m[IU]/mL — ABNORMAL HIGH (ref <5)

## 2017-09-02 LAB — URINALYSIS, ROUTINE W REFLEX MICROSCOPIC
Bilirubin Urine: NEGATIVE
Glucose, UA: NEGATIVE mg/dL
Hgb urine dipstick: NEGATIVE
Ketones, ur: NEGATIVE mg/dL
LEUKOCYTES UA: NEGATIVE
Nitrite: NEGATIVE
Protein, ur: NEGATIVE mg/dL
Specific Gravity, Urine: 1.019 (ref 1.005–1.030)
pH: 6 (ref 5.0–8.0)

## 2017-09-02 LAB — POCT PREGNANCY, URINE: PREG TEST UR: POSITIVE — AB

## 2017-09-02 NOTE — Discharge Instructions (Signed)
Vaginal Bleeding During Pregnancy, First Trimester °A small amount of bleeding (spotting) from the vagina is common in early pregnancy. Sometimes the bleeding is normal and is not a problem, and sometimes it is a sign of something serious. Be sure to tell your doctor about any bleeding from your vagina right away. °Follow these instructions at home: °· Watch your condition for any changes. °· Follow your doctor's instructions about how active you can be. °· If you are on bed rest: °? You may need to stay in bed and only get up to use the bathroom. °? You may be allowed to do some activities. °? If you need help, make plans for someone to help you. °· Write down: °? The number of pads you use each day. °? How often you change pads. °? How soaked (saturated) your pads are. °· Do not use tampons. °· Do not douche. °· Do not have sex or orgasms until your doctor says it is okay. °· If you pass any tissue from your vagina, save the tissue so you can show it to your doctor. °· Only take medicines as told by your doctor. °· Do not take aspirin because it can make you bleed. °· Keep all follow-up visits as told by your doctor. °Contact a doctor if: °· You bleed from your vagina. °· You have cramps. °· You have labor pains. °· You have a fever that does not go away after you take medicine. °Get help right away if: °· You have very bad cramps in your back or belly (abdomen). °· You pass large clots or tissue from your vagina. °· You bleed more. °· You feel light-headed or weak. °· You pass out (faint). °· You have chills. °· You are leaking fluid or have a gush of fluid from your vagina. °· You pass out while pooping (having a bowel movement). °This information is not intended to replace advice given to you by your health care provider. Make sure you discuss any questions you have with your health care provider. °Document Released: 08/05/2013 Document Revised: 08/27/2015 Document Reviewed: 11/26/2012 °Elsevier Interactive  Patient Education © 2018 [employer] Inc. ° °

## 2017-09-02 NOTE — MAU Note (Signed)
Pt reports she is 4-[redacted] weeks pregnant and today she began having some bleeding and pain.

## 2017-09-02 NOTE — MAU Provider Note (Signed)
History   [id number] @ 6.4 wks by LMP in with c/o xcramping and bleeding. States started early this morning. When she went to BR she states she passed a sac type round tissue into commode. sge feels strongly she passed fetus. sm amt bleeding now.  CSN: [id number]  Arrival date and time: 09/02/17 1031   None     Chief Complaint  Patient presents with  . Abdominal Pain  . Possible Pregnancy  . Vaginal Bleeding   HPI  OB History    Gravida  4   Para  2   Term  2   Preterm      AB  1   Living  2     SAB      TAB  1   Ectopic      Multiple  0   Live Births  2           Past Medical History:  Diagnosis Date  . GERD (gastroesophageal reflux disease)   . Trichomonal vaginitis 2014  . Vaginal Pap smear, abnormal     Past Surgical History:  Procedure Laterality Date  . HERNIA REPAIR    . WISDOM TOOTH EXTRACTION      Family History  Problem Relation Age of Onset  . Diabetes Maternal Grandmother     Social History   Tobacco Use  . Smoking status: Former Research scientist (life sciences)  . Smokeless tobacco: Never Used  Substance Use Topics  . Alcohol use: Not Currently    Comment: occassional when not pregnant  . Drug use: No    Allergies:  Allergies  Allergen Reactions  . Latex Itching and Swelling  . Penicillins     Pt states that she has been allergic since childhood but does not know what the reaction is    Medications Prior to Admission  Medication Sig Dispense Refill Last Dose  . ibuprofen (ADVIL,MOTRIN) 600 MG tablet Take 1 tablet (600 mg total) by mouth every 6 (six) hours as needed. 50 tablet 0   . Prenatal Vit-Fe Fumarate-FA (PRENATAL MULTIVITAMIN) TABS tablet Take 1 tablet by mouth daily at 12 noon.   08/19/2014 at Unknown time    Review of Systems  Constitutional: Negative.   HENT: Negative.   Eyes: Negative.   Respiratory: Negative.   Cardiovascular: Negative.   Gastrointestinal: Positive for abdominal pain.  Endocrine: Negative.   Genitourinary:  Positive for vaginal bleeding.  Musculoskeletal: Negative.   Skin: Negative.   Allergic/Immunologic: Negative.   Neurological: Negative.   Hematological: Negative.   Psychiatric/Behavioral: Negative.    Physical Exam   Blood pressure 107/67, pulse 99, temperature 98.9 F (37.2 C), temperature source Oral, resp. rate 19, height 5\' 8"  (1.727 m), weight 159 lb (72.1 kg), last menstrual period 07/18/2017, SpO2 100 %, unknown if currently breastfeeding.  Physical Exam  Constitutional: She is oriented to person, place, and time. She appears well-developed and well-nourished.  HENT:  Head: Normocephalic.  Neck: Normal range of motion.  Cardiovascular: Normal rate, regular rhythm, normal heart sounds and intact distal pulses.  Respiratory: Effort normal and breath sounds normal.  GI: Soft. Bowel sounds are normal.  Genitourinary: Vagina normal and uterus normal.  Genitourinary Comments: Scant amt bright red bleeding on exam  Musculoskeletal: Normal range of motion.  Neurological: She is alert and oriented to person, place, and time. She has normal reflexes.  Skin: Skin is warm and dry.  Psychiatric: She has a normal mood and affect. Her behavior is normal. Judgment and thought content  normal.    MAU Course  Procedures  MDM Threat ab  Assessment and Plan  US shows early gestational and yolk sac in lower uterine segment. Discussed finding with pt and FOP she will come back to clinic Monday and 11:00 for repeat quant and follow up. Pelvic rest and to return if severe pain or heavy bleeding.  Koren Shiver 09/02/2017, 10:57 AM

## 2017-09-05 ENCOUNTER — Ambulatory Visit (INDEPENDENT_AMBULATORY_CARE_PROVIDER_SITE_OTHER): Payer: Medicaid Other

## 2017-09-05 DIAGNOSIS — O200 Threatened abortion: Secondary | ICD-10-CM

## 2017-09-05 LAB — HCG, QUANTITATIVE, PREGNANCY: hCG, Beta Chain, Quant, S: 103 m[IU]/mL — ABNORMAL HIGH (ref <5)

## 2017-09-05 NOTE — Progress Notes (Signed)
Notified Luiz Blare, DO pt's STAT beta results.  Provider recommendation is that she is having a miscarriage and to f/u in two days with NON STAT beta lab draw. I explained to the pt that the bleeding should slow down with drop in her lab levels but to continue to watch for signs of infection.  Pt stated understanding with no further questions.

## 2017-09-05 NOTE — Progress Notes (Signed)
Patient presents to office today for stat bhcg. Patient reports continued bleeding like a period and occasional cramping. Discussed with patient we are monitoring her stat bhcg levels and asked she wait in lobby for results/updated plan of care. Patient verbalized understanding & had no questions at this time.

## 2017-09-05 NOTE — Progress Notes (Signed)
Chart reviewed for nurse visit. Agree with plan of care.   Katheren Shams, DO 09/05/2017 2:22 PM

## 2017-09-07 ENCOUNTER — Other Ambulatory Visit: Payer: Self-pay | Admitting: General Practice

## 2017-09-07 ENCOUNTER — Other Ambulatory Visit: Payer: Medicaid Other

## 2017-09-07 DIAGNOSIS — O039 Complete or unspecified spontaneous abortion without complication: Secondary | ICD-10-CM

## 2017-09-08 LAB — BETA HCG QUANT (REF LAB): HCG QUANT: 37 m[IU]/mL

## 2017-11-27 ENCOUNTER — Emergency Department (HOSPITAL_BASED_OUTPATIENT_CLINIC_OR_DEPARTMENT_OTHER)
Admission: EM | Admit: 2017-11-27 | Discharge: 2017-11-27 | Disposition: A | Discharge status: Home / Self Care | Payer: Medicaid Other | Attending: Emergency Medicine | Admitting: Emergency Medicine

## 2017-11-27 ENCOUNTER — Encounter (HOSPITAL_BASED_OUTPATIENT_CLINIC_OR_DEPARTMENT_OTHER): Payer: Self-pay | Admitting: *Deleted

## 2017-11-27 ENCOUNTER — Other Ambulatory Visit: Payer: Self-pay

## 2017-11-27 DIAGNOSIS — Z5321 Procedure and treatment not carried out due to patient leaving prior to being seen by health care provider: Secondary | ICD-10-CM | POA: Diagnosis not present

## 2017-11-27 DIAGNOSIS — R0602 Shortness of breath: Secondary | ICD-10-CM | POA: Insufficient documentation

## 2017-11-27 NOTE — ED Triage Notes (Signed)
Called to take to treatment room  No response from lobby 

## 2017-11-27 NOTE — ED Notes (Signed)
Pt called  No response from lobby  
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
## 2017-11-29 NOTE — ED Notes (Signed)
Follow up call made  Was seen by pcp  11/29/17//0931  s Ainhoa Rallo rn

## 2018-06-05 ENCOUNTER — Other Ambulatory Visit: Payer: Self-pay

## 2018-06-05 ENCOUNTER — Inpatient Hospital Stay (HOSPITAL_COMMUNITY)
Admission: EM | Admit: 2018-06-05 | Discharge: 2018-06-09 | DRG: 918 | Disposition: A | Discharge status: Home / Self Care | Payer: Medicaid Other | Attending: Internal Medicine | Admitting: Internal Medicine

## 2018-06-05 ENCOUNTER — Encounter (HOSPITAL_COMMUNITY): Payer: Self-pay | Admitting: *Deleted

## 2018-06-05 ENCOUNTER — Emergency Department (HOSPITAL_COMMUNITY): Payer: Medicaid Other

## 2018-06-05 DIAGNOSIS — K5901 Slow transit constipation: Secondary | ICD-10-CM

## 2018-06-05 DIAGNOSIS — K625 Hemorrhage of anus and rectum: Secondary | ICD-10-CM | POA: Diagnosis present

## 2018-06-05 DIAGNOSIS — Y92009 Unspecified place in unspecified non-institutional (private) residence as the place of occurrence of the external cause: Secondary | ICD-10-CM

## 2018-06-05 DIAGNOSIS — K921 Melena: Secondary | ICD-10-CM

## 2018-06-05 DIAGNOSIS — Z886 Allergy status to analgesic agent status: Secondary | ICD-10-CM

## 2018-06-05 DIAGNOSIS — Z87891 Personal history of nicotine dependence: Secondary | ICD-10-CM

## 2018-06-05 DIAGNOSIS — T490X1A Poisoning by local antifungal, anti-infective and anti-inflammatory drugs, accidental (unintentional), initial encounter: Principal | ICD-10-CM | POA: Diagnosis present

## 2018-06-05 DIAGNOSIS — Z880 Allergy status to penicillin: Secondary | ICD-10-CM

## 2018-06-05 DIAGNOSIS — Z91040 Latex allergy status: Secondary | ICD-10-CM

## 2018-06-05 DIAGNOSIS — R1110 Vomiting, unspecified: Secondary | ICD-10-CM

## 2018-06-05 DIAGNOSIS — D649 Anemia, unspecified: Secondary | ICD-10-CM | POA: Diagnosis present

## 2018-06-05 DIAGNOSIS — K521 Toxic gastroenteritis and colitis: Secondary | ICD-10-CM | POA: Diagnosis present

## 2018-06-05 DIAGNOSIS — K219 Gastro-esophageal reflux disease without esophagitis: Secondary | ICD-10-CM | POA: Diagnosis present

## 2018-06-05 DIAGNOSIS — Z79899 Other long term (current) drug therapy: Secondary | ICD-10-CM

## 2018-06-05 LAB — I-STAT BETA HCG BLOOD, ED (MC, WL, AP ONLY)

## 2018-06-05 LAB — CBC
HEMATOCRIT: 37.3 % (ref 36.0–46.0)
Hemoglobin: 11.8 g/dL — ABNORMAL LOW (ref 12.0–15.0)
MCH: 27.6 pg (ref 26.0–34.0)
MCHC: 31.6 g/dL (ref 30.0–36.0)
MCV: 87.4 fL (ref 80.0–100.0)
NRBC: 0 % (ref 0.0–0.2)
PLATELETS: 240 10*3/uL (ref 150–400)
RBC: 4.27 MIL/uL (ref 3.87–5.11)
RDW: 14.4 % (ref 11.5–15.5)
WBC: 10.3 10*3/uL (ref 4.0–10.5)

## 2018-06-05 LAB — COMPREHENSIVE METABOLIC PANEL
ALBUMIN: 4.5 g/dL (ref 3.5–5.0)
ALT: 20 U/L (ref 0–44)
AST: 21 U/L (ref 15–41)
Alkaline Phosphatase: 59 U/L (ref 38–126)
Anion gap: 7 (ref 5–15)
BUN: 10 mg/dL (ref 6–20)
CALCIUM: 9.4 mg/dL (ref 8.9–10.3)
CHLORIDE: 102 mmol/L (ref 98–111)
CO2: 27 mmol/L (ref 22–32)
CREATININE: 0.63 mg/dL (ref 0.44–1.00)
GFR calc non Af Amer: >60 mL/min (ref >60)
GLUCOSE: 119 mg/dL — AB (ref 70–99)
Potassium: 3.7 mmol/L (ref 3.5–5.1)
SODIUM: 136 mmol/L (ref 135–145)
Total Bilirubin: 1.2 mg/dL (ref 0.3–1.2)
Total Protein: 7.7 g/dL (ref 6.5–8.1)

## 2018-06-05 LAB — URINALYSIS, ROUTINE W REFLEX MICROSCOPIC
Bilirubin Urine: NEGATIVE
GLUCOSE, UA: NEGATIVE mg/dL
Hgb urine dipstick: NEGATIVE
Ketones, ur: 5 mg/dL — AB
LEUKOCYTE UA: NEGATIVE
Nitrite: NEGATIVE
PROTEIN: NEGATIVE mg/dL
Specific Gravity, Urine: 1.011 (ref 1.005–1.030)
pH: 7 (ref 5.0–8.0)

## 2018-06-05 LAB — POC OCCULT BLOOD, ED: FECAL OCCULT BLD: POSITIVE — AB

## 2018-06-05 LAB — LIPASE, BLOOD: Lipase: 29 U/L (ref 11–51)

## 2018-06-05 MED ORDER — SODIUM CHLORIDE 0.9 % IV BOLUS
1000.0000 mL | Freq: Once | INTRAVENOUS | Status: AC
  Administered 2018-06-06: 1000 mL via INTRAVENOUS

## 2018-06-05 MED ORDER — SODIUM CHLORIDE 0.9% FLUSH: 3.0000 mL | Freq: Once | INTRAVENOUS | Status: DC

## 2018-06-05 MED ORDER — KETOROLAC TROMETHAMINE 30 MG/ML IJ SOLN
30.0000 mg | Freq: Once | INTRAMUSCULAR | Status: AC
  Administered 2018-06-06: 30 mg via INTRAVENOUS
  Filled 2018-06-05: qty 1

## 2018-06-05 MED ORDER — METOCLOPRAMIDE HCL 5 MG/ML IJ SOLN
10.0000 mg | Freq: Once | INTRAMUSCULAR | Status: AC
  Administered 2018-06-06: 10 mg via INTRAVENOUS
  Filled 2018-06-05: qty 2

## 2018-06-05 NOTE — ED Triage Notes (Signed)
Pt arrives reporting 2 days of constipation. Today, around 1430 she used a peroxide diluted enema. Reporting about 15 minutes later she became dizzy and sweaty. Since then she has had 7 episodes of bright red diarrhea and vomiting (clear emesis). Reporting lower abdominal pain and rectal burning. Drinking red smoothie in triage, spoke with patient about remaining NPO at this time.

## 2018-06-05 NOTE — ED Notes (Signed)
Patient transported to X-ray 

## 2018-06-06 DIAGNOSIS — K921 Melena: Secondary | ICD-10-CM | POA: Diagnosis not present

## 2018-06-06 DIAGNOSIS — K521 Toxic gastroenteritis and colitis: Secondary | ICD-10-CM | POA: Diagnosis not present

## 2018-06-06 DIAGNOSIS — K219 Gastro-esophageal reflux disease without esophagitis: Secondary | ICD-10-CM

## 2018-06-06 DIAGNOSIS — D649 Anemia, unspecified: Secondary | ICD-10-CM | POA: Diagnosis not present

## 2018-06-06 DIAGNOSIS — K625 Hemorrhage of anus and rectum: Secondary | ICD-10-CM | POA: Diagnosis not present

## 2018-06-06 DIAGNOSIS — K5901 Slow transit constipation: Secondary | ICD-10-CM | POA: Diagnosis not present

## 2018-06-06 LAB — TYPE AND SCREEN
ABO/RH(D): O POS
Antibody Screen: NEGATIVE

## 2018-06-06 LAB — HIV ANTIBODY (ROUTINE TESTING W REFLEX): HIV Screen 4th Generation wRfx: NONREACTIVE

## 2018-06-06 LAB — BASIC METABOLIC PANEL
Anion gap: 8 (ref 5–15)
BUN: 9 mg/dL (ref 6–20)
CHLORIDE: 107 mmol/L (ref 98–111)
CO2: 22 mmol/L (ref 22–32)
Calcium: 8.5 mg/dL — ABNORMAL LOW (ref 8.9–10.3)
Creatinine, Ser: 0.61 mg/dL (ref 0.44–1.00)
GFR calc Af Amer: >60 mL/min (ref >60)
GFR calc non Af Amer: >60 mL/min (ref >60)
Glucose, Bld: 92 mg/dL (ref 70–99)
Potassium: 3.7 mmol/L (ref 3.5–5.1)
SODIUM: 137 mmol/L (ref 135–145)

## 2018-06-06 LAB — CBC
HCT: 37 % (ref 36.0–46.0)
HCT: 35.4 % — ABNORMAL LOW (ref 36.0–46.0)
Hemoglobin: 11.5 g/dL — ABNORMAL LOW (ref 12.0–15.0)
Hemoglobin: 10.9 g/dL — ABNORMAL LOW (ref 12.0–15.0)
MCH: 27.6 pg (ref 26.0–34.0)
MCH: 27.1 pg (ref 26.0–34.0)
MCHC: 31.1 g/dL (ref 30.0–36.0)
MCHC: 30.8 g/dL (ref 30.0–36.0)
MCV: 88.7 fL (ref 80.0–100.0)
MCV: 88.1 fL (ref 80.0–100.0)
NRBC: 0 % (ref 0.0–0.2)
NRBC: 0 % (ref 0.0–0.2)
PLATELETS: 167 10*3/uL (ref 150–400)
PLATELETS: 222 10*3/uL (ref 150–400)
RBC: 4.17 MIL/uL (ref 3.87–5.11)
RBC: 4.02 MIL/uL (ref 3.87–5.11)
RDW: 14.4 % (ref 11.5–15.5)
RDW: 14.2 % (ref 11.5–15.5)
WBC: 8.5 10*3/uL (ref 4.0–10.5)
WBC: 8.5 10*3/uL (ref 4.0–10.5)

## 2018-06-06 LAB — PROTIME-INR
INR: 1 (ref 0.8–1.2)
PROTHROMBIN TIME: 13.3 s (ref 11.4–15.2)

## 2018-06-06 LAB — ABO/RH: ABO/RH(D): O POS

## 2018-06-06 LAB — APTT: aPTT: 21 seconds — ABNORMAL LOW (ref 24–36)

## 2018-06-06 MED ORDER — MORPHINE SULFATE (PF) 2 MG/ML IV SOLN
2.0000 mg | INTRAVENOUS | Status: DC | PRN
  Administered 2018-06-06 – 2018-06-09 (×16): 2 mg via INTRAVENOUS
  Filled 2018-06-06 (×16): qty 1

## 2018-06-06 MED ORDER — RISAQUAD PO CAPS
ORAL_CAPSULE | ORAL | Status: DC
  Administered 2018-06-06 – 2018-06-08 (×2): 1 via ORAL
  Filled 2018-06-06 (×3): qty 1

## 2018-06-06 MED ORDER — PANTOPRAZOLE SODIUM 40 MG IV SOLR
40.0000 mg | Freq: Two times a day (BID) | INTRAVENOUS | Status: DC
  Administered 2018-06-06 – 2018-06-09 (×8): 40 mg via INTRAVENOUS
  Filled 2018-06-06 (×8): qty 40

## 2018-06-06 MED ORDER — AMPHETAMINE-DEXTROAMPHETAMINE 10 MG PO TABS
15.0000 mg | ORAL_TABLET | Freq: Two times a day (BID) | ORAL | Status: DC
  Administered 2018-06-08 (×2): 15 mg via ORAL
  Filled 2018-06-06 (×3): qty 2

## 2018-06-06 MED ORDER — SODIUM CHLORIDE 0.9 % IV SOLN
INTRAVENOUS | Status: DC
  Administered 2018-06-06 – 2018-06-08 (×7): via INTRAVENOUS

## 2018-06-06 MED ORDER — ONDANSETRON HCL 4 MG/2ML IJ SOLN
4.0000 mg | Freq: Three times a day (TID) | INTRAMUSCULAR | Status: DC | PRN
  Administered 2018-06-06 – 2018-06-08 (×5): 4 mg via INTRAVENOUS
  Filled 2018-06-06 (×5): qty 2

## 2018-06-06 NOTE — ED Provider Notes (Signed)
[facility] DEPT Provider Note   CSN: [id number] Arrival date & time: 06/05/18  2052    History   Chief Complaint Chief Complaint  Patient presents with  . Rectal Pain    HPI Bailey Martin is a 33 y.o. female.    33 year old female with history of esophageal reflux presents to the emergency department for evaluation of abdominal pain.  She has had constipation for the past 2 days.  Decided to make a homemade hydrogen peroxide enema for management of constipation.  She used this at 1430.  Reports diluting an unknown amount of hydrogen peroxide and water.  15 minutes following the enema, she began to feel dizzy and sweaty.  She initially had watery diarrhea bowel movements, but has since had 7 episodes of hematochezia.  She reports a burning generalized abdominal and rectal discomfort.  She has had some vomiting with bowel movements which have been nonbloody.  Was able to tolerate PO in triage.  Abdominal surgical history significant for hernia repair.  The history is provided by the patient. No language interpreter was used.    Past Medical History:  Diagnosis Date  . GERD (gastroesophageal reflux disease)   . Trichomonal vaginitis 2014  . Vaginal Pap smear, abnormal     Patient Active Problem List   Diagnosis Date Noted  . Rectal bleeding 06/06/2018  . Normocytic anemia 06/06/2018  . Prolonged pregnancy 08/19/2014  . Status post vaginal delivery 08/19/2014  . Meconium in amniotic fluid affecting management of mother, delivered 08/19/2014  . Post-term pregnancy, 40-42 weeks of gestation     Past Surgical History:  Procedure Laterality Date  . HERNIA REPAIR    . WISDOM TOOTH EXTRACTION       OB History    Gravida  4   Para  2   Term  2   Preterm      AB  1   Living  2     SAB      TAB  1   Ectopic      Multiple  0   Live Births  2            Home Medications    Prior to Admission medications   Medication  Sig Start Date End Date Taking? Authorizing Provider  amphetamine-dextroamphetamine (ADDERALL) 15 MG tablet Take 1 tablet by mouth 2 (two) times daily. 05/02/18  Yes [provider]  Probiotic Product (PROBIOTIC PO) Take 1 capsule by mouth every other day.   Yes [provider]    Family History Family History  Problem Relation Age of Onset  . Diabetes Maternal Grandmother     Social History Social History   Tobacco Use  . Smoking status: Former Research scientist (life sciences)  . Smokeless tobacco: Never Used  Substance Use Topics  . Alcohol use: Yes    Comment: occassional when not pregnant  . Drug use: No     Allergies   Penicillins; Tylenol [acetaminophen]; and Latex   Review of Systems Review of Systems Ten systems reviewed and are negative for acute change, except as noted in the HPI.    Physical Exam Updated Vital Signs BP 125/81 (BP Location: Left Arm)   Pulse (!) 57   Temp 98.7 F (37.1 C) (Oral)   Resp 16   Ht 5\' 8"  (1.727 m)   Wt 71.7 kg   LMP 05/22/2018   SpO2 100%   BMI 24.02 kg/m   Physical Exam Vitals signs and nursing note reviewed.  Constitutional:      General: She is not in acute distress.    Appearance: She is well-developed. She is not diaphoretic.     Comments: Nontoxic appearing and in NAD  HENT:     Head: Normocephalic and atraumatic.  Eyes:     General: No scleral icterus.    Conjunctiva/sclera: Conjunctivae normal.  Neck:     Musculoskeletal: Normal range of motion.  Cardiovascular:     Rate and Rhythm: Normal rate and regular rhythm.     Pulses: Normal pulses.  Pulmonary:     Effort: Pulmonary effort is normal. No respiratory distress.     Breath sounds: No stridor. No wheezing.     Comments: Respirations even and unlabored. Lungs CTAB. Abdominal:     Comments: Generalized TTP. Abdomen soft, nondistended. No peritoneal signs.  Musculoskeletal: Normal range of motion.  Skin:    General: Skin is warm and dry.     Coloration: Skin  is not pale.     Findings: No erythema or rash.  Neurological:     Mental Status: She is alert and oriented to person, place, and time.  Psychiatric:        Behavior: Behavior normal.      ED Treatments / Results  Labs (all labs ordered are listed, but only abnormal results are displayed) Labs Reviewed  COMPREHENSIVE METABOLIC PANEL - Abnormal; Notable for the following components:      Result Value   Glucose, Bld 119 (*)    All other components within normal limits  CBC - Abnormal; Notable for the following components:   Hemoglobin 11.8 (*)    All other components within normal limits  URINALYSIS, ROUTINE W REFLEX MICROSCOPIC - Abnormal; Notable for the following components:   Ketones, ur 5 (*)    All other components within normal limits  POC OCCULT BLOOD, ED - Abnormal; Notable for the following components:   Fecal Occult Bld POSITIVE (*)    All other components within normal limits  LIPASE, BLOOD  I-STAT BETA HCG BLOOD, ED ([facility], [facility], [facility] ONLY)    EKG None  Radiology Dg Abd 2 Views  Result Date: 06/06/2018 CLINICAL DATA:  Vomiting and fever EXAM: ABDOMEN - 2 VIEW COMPARISON:  None. FINDINGS: Lung bases are clear. No free air beneath the diaphragm. Overall nonobstructed gas pattern. No radiopaque calculi. IMPRESSION: Nonobstructed bowel-gas pattern Electronically Signed   By: Donavan Foil M.D.   On: 06/06/2018 00:21    Procedures Procedures (including critical care time)  Medications Ordered in ED Medications  sodium chloride flush (NS) 0.9 % injection 3 mL (3 mLs Intravenous Not Given 06/05/18 2231)  ketorolac (TORADOL) 30 MG/ML injection 30 mg (has no administration in time range)  sodium chloride 0.9 % bolus 1,000 mL (has no administration in time range)  metoCLOPramide (REGLAN) injection 10 mg (has no administration in time range)     Initial Impression / Assessment and Plan / ED Course  I have reviewed the triage vital signs and the nursing notes.  Pertinent  labs & imaging results that were available during my care of the patient were reviewed by me and considered in my medical decision making (see chart for details).        Patient with onset of hematochezia after giving herself a homemade peroxide enema.  She has been experiencing rectal and abdominal burning discomfort.  She has no signs of perforation on abdominal x-ray.  Anemia is stable compared with 2015.  She has no signs  of acute peritonitis on exam.  Plan for observation admission for serial CBC testing.  She may require colonoscopy if hematochezia persists.  Case discussed with Dr. Blaine Hamper of [facility] who will admit.   Final Clinical Impressions(s) / ED Diagnoses   Final diagnoses:  Vomiting  Hematochezia  Chemical colitis    ED Discharge Orders    None       Antonietta Breach, PA-C 06/06/18 0043    Shanon Rosser, MD 06/06/18 [phone]

## 2018-06-06 NOTE — Consult Note (Addendum)
Consultation  Referring Provider: Dr. Tawanna Solo     Primary Care Physician:  Patient, No Pcp Per Primary Gastroenterologist: Althia Forts        Reason for Consultation: Hematochezia/ Abdominal Pain             HPI:   Bailey Martin is a 33 y.o. female with a past medical history as listed below, who presented to the ER on 06/05/2018 with rectal bleeding and abdominal pain.    Today, the patient explains that she had been constipated for the past 2 days after starting iron the day before for a "mild anemia" per the patient, and she tried to treat herself with a homemade hydrogen peroxide enema (she learned the formula from YouTube, just hydrogen peroxide and water), she used this around 230 and about 5 minutes after using the enema she began to feel dizzy and sweaty and started having watery diarrhea which then became bloody diarrhea at least 7 times.  Also developed abdominal pain which was generalized and constant, rated as a 10/10 described as "burning" and nonradiating.  Also had nausea and vomited a few times.  Proceeded to the ER.  Since admission has had 7 further bowel movements which are mostly just blood, but patient tells me is decreasing in amount.  Still bright red blood and some nausea. Pain is some better with pain medication and also with standing.    Tells me that she will usually use a green coffee enema once a month at home because "I heard it was good for my intestines to "power wash everything inside every once in a while".    Denies fever or chills.  ED course: Hemoglobin 11.8 (11.2 on 05/21/2014), positive FOBT, negative pregnancy test, x-ray of abdomen showed nonobstructive bowel gas pattern  Past Medical History:  Diagnosis Date  . GERD (gastroesophageal reflux disease)   . Trichomonal vaginitis 2014  . Vaginal Pap smear, abnormal     Past Surgical History:  Procedure Laterality Date  . HERNIA REPAIR    . WISDOM TOOTH EXTRACTION      Family History  Problem  Relation Age of Onset  . Diabetes Maternal Grandmother     Social History   Tobacco Use  . Smoking status: Former Research scientist (life sciences)  . Smokeless tobacco: Never Used  Substance Use Topics  . Alcohol use: Yes    Comment: occassional when not pregnant  . Drug use: No    Prior to Admission medications   Medication Sig Start Date End Date Taking? Authorizing Provider  amphetamine-dextroamphetamine (ADDERALL) 15 MG tablet Take 1 tablet by mouth 2 (two) times daily. 05/02/18  Yes [provider]  Probiotic Product (PROBIOTIC PO) Take 1 capsule by mouth every other day.   Yes [provider]    Current Facility-Administered Medications  Medication Dose Route Frequency Provider Last Rate Last Dose  . 0.9 %  sodium chloride infusion   Intravenous Continuous Ivor Costa, MD 125 mL/hr at 06/06/18 0600    . acidophilus (RISAQUAD) capsule   Oral Henreitta Cea, MD   1 capsule at 06/06/18 1016  . amphetamine-dextroamphetamine (ADDERALL) tablet 15 mg  15 mg Oral BID WC Ivor Costa, MD      . morphine 2 MG/ML injection 2 mg  2 mg Intravenous Q4H PRN Ivor Costa, MD   2 mg at 06/06/18 1016  . ondansetron (ZOFRAN) injection 4 mg  4 mg Intravenous Q8H PRN Ivor Costa, MD   4 mg at 06/06/18 0602  .  pantoprazole (PROTONIX) injection 40 mg  40 mg Intravenous Q12H Ivor Costa, MD   40 mg at 06/06/18 1016  . sodium chloride flush (NS) 0.9 % injection 3 mL  3 mL Intravenous Once Ivor Costa, MD        Allergies as of 06/05/2018 - Review Complete 06/05/2018  Allergen Reaction Noted  . Penicillins Anaphylaxis 12/31/2013  . Tylenol [acetaminophen] Anaphylaxis 06/05/2018  . Latex Itching and Swelling 08/18/2014     Review of Systems:    Constitutional: No weight loss, fever or chills Skin: No rash  Cardiovascular: No chest pain Respiratory: No SOB Gastrointestinal: See HPI and otherwise negative Genitourinary: No dysuria Neurological: No headache Musculoskeletal: No new muscle or joint  pain Hematologic: No bruising Psychiatric: No history of depression or anxiety    Physical Exam:  Vital signs in last 24 hours: Temp:  [98.3 F (36.8 C)-98.7 F (37.1 C)] 98.3 F (36.8 C) (03/04 0520) Pulse Rate:  [53-70] 54 (03/04 0520) Resp:  [16-18] 16 (03/04 0520) BP: (100-125)/(58-84) 101/60 (03/04 0520) SpO2:  [94 %-100 %] 98 % (03/04 0520) Weight:  [71.7 kg] 71.7 kg (03/03 2103) Last BM Date: 06/06/18 General:   Pleasant AA female appears to be in NAD, Well developed, Well nourished, alert and cooperative Head:  Normocephalic and atraumatic. Eyes:   PEERL, EOMI. No icterus. Conjunctiva pink. Ears:  Normal auditory acuity. Neck:  Supple Throat: Oral cavity and pharynx without inflammation, swelling or lesion. Lungs: Respirations even and unlabored. Lungs clear to auscultation bilaterally.   No wheezes, crackles, or rhonchi.  Heart: Normal S1, S2. No MRG. Regular rate and rhythm. No peripheral edema, cyanosis or pallor.  Abdomen:  Soft, nondistended, moderate generalized ttp. No rebound or guarding. Normal bowel sounds. No appreciable masses or hepatomegaly. Rectal:  Not performed.  Msk:  Symmetrical without gross deformities. Peripheral pulses intact.  Extremities:  Without edema, no deformity or joint abnormality.  Neurologic:  Alert and  oriented x4;  grossly normal neurologically.  Skin:   Dry and intact without significant lesions or rashes. Psychiatric: Demonstrates good judgement and reason without abnormal affect or behaviors.  LAB RESULTS: Recent Labs    06/05/18 2125 06/06/18 0236 06/06/18 0502  WBC 10.3 8.5 8.5  HGB 11.8* 11.5* 10.9*  HCT 37.3 37.0 35.4*  PLT 240 167 222   BMET Recent Labs    06/05/18 2125 06/06/18 0502  NA 136 137  K 3.7 3.7  CL 102 107  CO2 27 22  GLUCOSE 119* 92  BUN 10 9  CREATININE 0.63 0.61  CALCIUM 9.4 8.5*   LFT Recent Labs    06/05/18 2125  PROT 7.7  ALBUMIN 4.5  AST 21  ALT 20  ALKPHOS 59  BILITOT 1.2    PT/INR Recent Labs    06/06/18 0236  LABPROT 13.3  INR 1.0    STUDIES: Dg Abd 2 Views  Result Date: 06/06/2018 CLINICAL DATA:  Vomiting and fever EXAM: ABDOMEN - 2 VIEW COMPARISON:  None. FINDINGS: Lung bases are clear. No free air beneath the diaphragm. Overall nonobstructed gas pattern. No radiopaque calculi. IMPRESSION: Nonobstructed bowel-gas pattern Electronically Signed   By: Donavan Foil M.D.   On: 06/06/2018 00:21    Impression / Plan:   Impression: 1.  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  Anemia:  Likely related to above 3.  Abdominal pain: Generalized abdominal pain started after enema as above  Plan: 1.  Continue supportive measures. 2.  Continue to monitor hemoglobin and transfuse as needed less than 7 3.  No need for emergent endoscopic procedures as bleeding and abdominal pain are likely all related to recent enema.  If patient has increased bleeding or continues with abdominal pain may need to consider one in the future. 4.  We will allow clear liquids today 5.  Patient should be using MiraLAX for constipation instead of enemas.  We did discuss this during my visit with her. 6.  Patient should follow in our GI office and can likely have an outpatient colonoscopy there in the future. 7.  Await any further recommendations from Dr. Henrene Pastor later today.  Thank you for your kind consultation, we will continue to follow.  Lavone Nian [facility]  06/06/2018, 10:54 AM  GI ATTENDING  History, laboratories, x-rays reviewed.  Patient personally seen and examined.  Agree with consultation note as outlined above.  The patient developed abdominal pain and bloody diarrhea shortly after self administering a homemade hydrogen peroxide enema.  Her  abdominal pain has improved and less bloody bowel movements.  Abdominal exam is benign.  There have been clear reports of colonic mucosal necrosis and even perforation and bloody diarrhea shortly after administration of hydrogen peroxide enemas as in this case.  Currently care is supportive.  Advance diet as tolerated.  Observe overnight for problems.  If she remains stable without further bleeding and a benign abdomen she can be discharged.  No further GI work-up is required.  However, she should limit herself to conventional medical therapies for the treatment of constipation.  I would recommend MiraLAX to be titrated to desired effect.  Docia Chuck. Geri Seminole., M.D. [facility] Division of Gastroenterology

## 2018-06-06 NOTE — Progress Notes (Addendum)
Patient is a 33 year old female with history of GERD who presents to the emergency department with complaints of abdominal pain and rectal bleeding.  Reported that she treated herself with homemade hydrogen peroxide enema which she learned from YouTube to make the formula. After getting the enema, she started beginning feeling dizzy and sweaty and had watery diarrhea which then became bloody for at least 7 times.  Also started having abdominal pain. Patient admitted for monitoring.  GI has been consulted today.  Her hemoglobin has remained stable.  Abdomen pain has improved. Continue Protonix IV, nausea medications and pain medications for now.  Will check CBC tomorrow. If there is no plan for intervention from GI, will most likely discharge her tomorrow morning. Patient seen by Dr.Niu this morning.

## 2018-06-06 NOTE — ED Notes (Signed)
ED TO INPATIENT HANDOFF REPORT  ED Nurse Name and Phone #: Kerin [place], [phone]  S Name/Age/Gender Bailey Martin 33 y.o. female Room/Bed: WA07/WA07  Code Status   Code Status: Full Code  Home/SNF/Other Home Patient oriented to: self, place, time and situation Is this baseline? Yes   Triage Complete: Triage complete  Chief Complaint Blood in stool  Triage Note Pt arrives reporting 2 days of constipation. Today, around 1430 she used a peroxide diluted enema. Reporting about 15 minutes later she became dizzy and sweaty. Since then she has had 7 episodes of bright red diarrhea and vomiting (clear emesis). Reporting lower abdominal pain and rectal burning. Drinking red smoothie in triage, spoke with patient about remaining NPO at this time.    Allergies Allergies  Allergen Reactions  . Penicillins Anaphylaxis    Pt states that she has been allergic since childhood but does not know what the reaction is Did it involve swelling of the face/tongue/throat, SOB, or low BP? Yes Did it involve sudden or severe rash/hives, skin peeling, or any reaction on the inside of your mouth or nose? Yes Did you need to seek medical attention at a hospital or doctor's office? Yes When did it last happen?Childhood rxn If all above answers are "NO", may proceed with cephalosporin use.  . Tylenol [Acetaminophen] Anaphylaxis    Had rxn to liquid tylenol  . Latex Itching and Swelling    Level of Care/Admitting Diagnosis ED Disposition    ED Disposition Condition [facility] Area: [facility] [[id number]]  Level of Care: Med-Surg [16]  Diagnosis: Chemical colitis [762831]  Admitting Physician: Ivor Costa [[id number]]  Attending Physician: Ivor Costa [[id number]]  PT Class (Do Not Modify): Observation [104]  PT Acc Code (Do Not Modify): Observation [10022]       B Medical/Surgery History Past Medical History:  Diagnosis Date  . GERD (gastroesophageal reflux disease)   .  Trichomonal vaginitis 2014  . Vaginal Pap smear, abnormal    Past Surgical History:  Procedure Laterality Date  . HERNIA REPAIR    . WISDOM TOOTH EXTRACTION       A IV Location/Drains/Wounds Patient Lines/Drains/Airways Status   Active Line/Drains/Airways    Name:   Placement date:   Placement time:   Site:   Days:   Peripheral IV 06/06/18 Right;Upper Forearm   06/06/18    0045    Forearm   less than 1          Intake/Output Last 24 hours  Intake/Output Summary (Last 24 hours) at 06/06/2018 0159 Last data filed at 06/06/2018 0158 Gross per 24 hour  Intake 297.97 ml  Output -  Net 297.97 ml    Labs/Imaging Results for orders placed or performed during the hospital encounter of 06/05/18 (from the past 48 hour(s))  Lipase, blood     Status: None   Collection Time: 06/05/18  9:25 PM  Result Value Ref Range   Lipase 29 11 - 51 U/L    Comment: Performed at [facility], [place] [address] [place], [place] [address]  Comprehensive metabolic panel     Status: Abnormal   Collection Time: 06/05/18  9:25 PM  Result Value Ref Range   Sodium 136 135 - 145 mmol/L   Potassium 3.7 3.5 - 5.1 mmol/L   Chloride 102 98 - 111 mmol/L   CO2 27 22 - 32 mmol/L   Glucose, Bld 119 (H) 70 - 99 mg/dL   BUN 10 6 -  20 mg/dL   Creatinine, Ser 0.63 0.44 - 1.00 mg/dL   Calcium 9.4 8.9 - 10.3 mg/dL   Total Protein 7.7 6.5 - 8.1 g/dL   Albumin 4.5 3.5 - 5.0 g/dL   AST 21 15 - 41 U/L   ALT 20 0 - 44 U/L   Alkaline Phosphatase 59 38 - 126 U/L   Total Bilirubin 1.2 0.3 - 1.2 mg/dL   GFR calc non Af Amer >60 >60 mL/min   GFR calc Af Amer >60 >60 mL/min   Anion gap 7 5 - 15    Comment: Performed at [facility], [place] [address] [place], [place] [address]  CBC     Status: Abnormal   Collection Time: 06/05/18  9:25 PM  Result Value Ref Range   WBC 10.3 4.0 - 10.5 K/uL   RBC 4.27 3.87 - 5.11 MIL/uL   Hemoglobin 11.8 (L) 12.0 - 15.0 g/dL   HCT 37.3 36.0 - 46.0 %    MCV 87.4 80.0 - 100.0 fL   MCH 27.6 26.0 - 34.0 pg   MCHC 31.6 30.0 - 36.0 g/dL   RDW 14.4 11.5 - 15.5 %   Platelets 240 150 - 400 K/uL   nRBC 0.0 0.0 - 0.2 %    Comment: Performed at [facility], [place] [address] [place], [place] [address]  Urinalysis, Routine w reflex microscopic     Status: Abnormal   Collection Time: 06/05/18  9:25 PM  Result Value Ref Range   Color, Urine YELLOW YELLOW   APPearance CLEAR CLEAR   Specific Gravity, Urine 1.011 1.005 - 1.030   pH 7.0 5.0 - 8.0   Glucose, UA NEGATIVE NEGATIVE mg/dL   Hgb urine dipstick NEGATIVE NEGATIVE   Bilirubin Urine NEGATIVE NEGATIVE   Ketones, ur 5 (A) NEGATIVE mg/dL   Protein, ur NEGATIVE NEGATIVE mg/dL   Nitrite NEGATIVE NEGATIVE   Leukocytes,Ua NEGATIVE NEGATIVE    Comment: Performed at [place] [address] [place], [place] [address]  I-Stat beta hCG blood, ED     Status: None   Collection Time: 06/05/18  9:29 PM  Result Value Ref Range   I-stat hCG, quantitative <5.0 <5 mIU/mL   Comment 3            Comment:   GEST. AGE      CONC.  (mIU/mL)   <=1 WEEK        5 - 50     2 WEEKS       50 - 500     3 WEEKS       100 - 10,000     4 WEEKS     1,000 - 30,000        FEMALE AND NON-PREGNANT FEMALE:     LESS THAN 5 mIU/mL   POC occult blood, ED Provider will collect     Status: Abnormal   Collection Time: 06/05/18 11:53 PM  Result Value Ref Range   Fecal Occult Bld POSITIVE (A) NEGATIVE   Dg Abd 2 Views  Result Date: 06/06/2018 CLINICAL DATA:  Vomiting and fever EXAM: ABDOMEN - 2 VIEW COMPARISON:  None. FINDINGS: Lung bases are clear. No free air beneath the diaphragm. Overall nonobstructed gas pattern. No radiopaque calculi. IMPRESSION: Nonobstructed bowel-gas pattern Electronically Signed   By: Donavan Foil M.D.   On: 06/06/2018 00:21    Pending Labs [employer] (From admission, onward)    Start     Ordered  06/06/18 9323  Basic metabolic panel  Tomorrow morning,    R     06/06/18 0126   06/06/18 0126  HIV antibody (Routine Testing)  Once,   R     06/06/18 0126   06/06/18 0125  CBC  Now then every 6 hours,   R     06/06/18 0125   06/06/18 0125  Protime-INR  Once,   R     06/06/18 0125   06/06/18 0125  APTT  Once,   R     06/06/18 0125   06/06/18 0125  Type and screen [place]  Once,   R    Comments:  [place]    06/06/18 0125          Vitals/Pain Today's Vitals   06/05/18 2305 06/05/18 2305 06/06/18 0034 06/06/18 0155  BP:   125/81   Pulse:   (!) 57   Resp:   16   Temp:      TempSrc:      SpO2:   100%   Weight:      Height:      PainSc: 10-Worst pain ever 10-Worst pain ever  2     Isolation Precautions No active isolations  Medications Medications  sodium chloride flush (NS) 0.9 % injection 3 mL (3 mLs Intravenous Not Given 06/05/18 2231)  amphetamine-dextroamphetamine (ADDERALL) tablet 1 tablet (has no administration in time range)  acidophilus (RISAQUAD) capsule (has no administration in time range)  pantoprazole (PROTONIX) injection 40 mg (has no administration in time range)  0.9 %  sodium chloride infusion (has no administration in time range)  morphine 2 MG/ML injection 2 mg (has no administration in time range)  ondansetron (ZOFRAN) injection 4 mg (has no administration in time range)  ketorolac (TORADOL) 30 MG/ML injection 30 mg (30 mg Intravenous Given 06/06/18 0047)  sodium chloride 0.9 % bolus 1,000 mL ( Intravenous Rate/Dose Verify 06/06/18 0158)  metoCLOPramide (REGLAN) injection 10 mg (10 mg Intravenous Given 06/06/18 0046)    Mobility walks Low fall risk   Focused Assessments    R Recommendations: See Admitting Provider Note  Report given to: Precious Bard, RN  Additional Notes:

## 2018-06-06 NOTE — H&P (Signed)
History and Physical    Bailey Martin PJA:[id number] DOB: 1985-07-20 DOA: 06/05/2018  Referring MD/NP/PA:   PCP: Patient, No Pcp Per   Patient coming from:  The patient is coming from home.  At baseline, pt is independent for most of ADL.        Chief Complaint: rectal bleeding and abdominal pain  HPI: Bailey Martin is a 33 y.o. female with medical history significant of GERD, who presents with rectal bleeding and abdominal pain.  Patient states that she has been constipated in the past 2 days. She treated herself with homemade hydrogen peroxide enema. She states that she learned the formula from YouTube. She used this at 1430.  Reports diluting an unknown amount of hydrogen peroxide and water. 5 minutes following the enema, she began to feel dizzy and sweaty, and started having watery diarrhea, but then became bloody diarrhea for at least 7 times. She also developed abdominal pain, which involves whole abdomen, constant, 10 out of 10 in severity, burning-like pain, nonradiating.  No fever or chills.  She also has nausea and vomited few times without blood in the vomitus.  Patient does not have chest pain, shortness breath, cough.  No fever or chills.  Denies symptoms of UTI or unilateral weakness.  ED Course: pt was found to have hemoglobin 11.8 which was 11.2 on 05/21/2014, positive FOBT, negative pregnancy test, negative urinalysis, electrolytes renal function okay, temperature normal, no tachycardia, no tachypnea, oxygen saturation 94% on room air.  X-ray of abdomen showed nonobstructive bowel gas pattern.  Patient is placed on MedSurg [facility] for observation.  Review of Systems:   General: no fevers, chills, no body weight gain, has fatigue HEENT: no blurry vision, hearing changes or sore throat Respiratory: no dyspnea, coughing, wheezing CV: no chest pain, no palpitations GI: has nausea, vomiting, abdominal pain, bloody diarrhea, constipation GU: no dysuria, burning on  urination, increased urinary frequency, hematuria  Ext: no leg edema Neuro: no unilateral weakness, numbness, or tingling, no vision change or hearing loss Skin: no rash, no skin tear. MSK: No muscle spasm, no deformity, no limitation of range of movement in spin Heme: No easy bruising.  Travel history: No recent long distant travel.  Allergy:  Allergies  Allergen Reactions  . Penicillins Anaphylaxis    Pt states that she has been allergic since childhood but does not know what the reaction is Did it involve swelling of the face/tongue/throat, SOB, or low BP? Yes Did it involve sudden or severe rash/hives, skin peeling, or any reaction on the inside of your mouth or nose? Yes Did you need to seek medical attention at a hospital or doctor's office? Yes When did it last happen?Childhood rxn If all above answers are "NO", may proceed with cephalosporin use.  . Tylenol [Acetaminophen] Anaphylaxis    Had rxn to liquid tylenol  . Latex Itching and Swelling    Past Medical History:  Diagnosis Date  . GERD (gastroesophageal reflux disease)   . Trichomonal vaginitis 2014  . Vaginal Pap smear, abnormal     Past Surgical History:  Procedure Laterality Date  . HERNIA REPAIR    . WISDOM TOOTH EXTRACTION      Social History:  reports that she has quit smoking. She has never used smokeless tobacco. She reports current alcohol use. She reports that she does not use drugs.  Family History:  Family History  Problem Relation Age of Onset  . Diabetes Maternal Grandmother      Prior to Admission medications  Medication Sig Start Date End Date Taking? Authorizing Provider  amphetamine-dextroamphetamine (ADDERALL) 15 MG tablet Take 1 tablet by mouth 2 (two) times daily. 05/02/18  Yes [provider]  Probiotic Product (PROBIOTIC PO) Take 1 capsule by mouth every other day.   Yes [provider]    Physical Exam: Vitals:   06/05/18 2103 06/06/18 0034 06/06/18 0226  BP:  110/84 125/81 (!) 100/58  Pulse: 70 (!) 57 (!) 53  Resp: 18 16   Temp: 98.7 F (37.1 C)  98.4 F (36.9 C)  TempSrc: Oral  Oral  SpO2: 94% 100% 99%  Weight: 71.7 kg    Height: 5\' 8"  (1.727 m)     General: Not in acute distress HEENT:       Eyes: PERRL, EOMI, no scleral icterus.       ENT: No discharge from the ears and nose, no pharynx injection, no tonsillar enlargement.        Neck: No JVD, no bruit, no mass felt. Heme: No neck lymph node enlargement. Cardiac: S1/S2, RRR, No murmurs, No gallops or rubs. Respiratory:  No rales, wheezing, rhonchi or rubs. GI: Soft, nondistended, diffused tenderness, no rebound pain, no organomegaly, BS present. GU: No hematuria Ext: No pitting leg edema bilaterally. 2+DP/PT pulse bilaterally. Musculoskeletal: No joint deformities, No joint redness or warmth, no limitation of ROM in spin. Skin: No rashes.  Neuro: Alert, oriented X3, cranial nerves II-XII grossly intact, moves all extremities normally. Psych: Patient is not psychotic, no suicidal or hemocidal ideation.  Labs on Admission: I have personally reviewed following labs and imaging studies  CBC: Recent Labs  Lab 06/05/18 2125 06/06/18 0236  WBC 10.3 8.5  HGB 11.8* 11.5*  HCT 37.3 37.0  MCV 87.4 88.7  PLT 240 323   Basic Metabolic Panel: Recent Labs  Lab 06/05/18 2125  NA 136  K 3.7  CL 102  CO2 27  GLUCOSE 119*  BUN 10  CREATININE 0.63  CALCIUM 9.4   GFR: Estimated Creatinine Clearance: 100.9 mL/min (by C-G formula based on SCr of 0.63 mg/dL). Liver Function Tests: Recent Labs  Lab 06/05/18 2125  AST 21  ALT 20  ALKPHOS 59  BILITOT 1.2  PROT 7.7  ALBUMIN 4.5   Recent Labs  Lab 06/05/18 2125  LIPASE 29   No results for input(s): AMMONIA in the last 168 hours. Coagulation Profile: Recent Labs  Lab 06/06/18 0236  INR 1.0   Cardiac Enzymes: No results for input(s): CKTOTAL, CKMB, CKMBINDEX, TROPONINI in the last 168 hours. BNP (last 3 results) No  results for input(s): PROBNP in the last 8760 hours. HbA1C: No results for input(s): HGBA1C in the last 72 hours. CBG: No results for input(s): GLUCAP in the last 168 hours. Lipid Profile: No results for input(s): CHOL, HDL, LDLCALC, TRIG, CHOLHDL, LDLDIRECT in the last 72 hours. Thyroid Function Tests: No results for input(s): TSH, T4TOTAL, FREET4, T3FREE, THYROIDAB in the last 72 hours. Anemia Panel: No results for input(s): VITAMINB12, FOLATE, FERRITIN, TIBC, IRON, RETICCTPCT in the last 72 hours. Urine analysis:    Component Value Date/Time   COLORURINE YELLOW 06/05/2018 2125   APPEARANCEUR CLEAR 06/05/2018 2125   LABSPEC 1.011 06/05/2018 2125   PHURINE 7.0 06/05/2018 2125   GLUCOSEU NEGATIVE 06/05/2018 2125   HGBUR NEGATIVE 06/05/2018 2125   BILIRUBINUR NEGATIVE 06/05/2018 2125   KETONESUR 5 (A) 06/05/2018 2125   PROTEINUR NEGATIVE 06/05/2018 2125   UROBILINOGEN 0.2 07/25/2014 0355   NITRITE NEGATIVE 06/05/2018 2125   LEUKOCYTESUR  NEGATIVE 06/05/2018 2125   Sepsis Labs: @LABRCNTIP (procalcitonin:4,lacticidven:4) )No results found for this or any previous visit (from the past 240 hour(s)).   Radiological Exams on Admission: Dg Abd 2 Views  Result Date: 06/06/2018 CLINICAL DATA:  Vomiting and fever EXAM: ABDOMEN - 2 VIEW COMPARISON:  None. FINDINGS: Lung bases are clear. No free air beneath the diaphragm. Overall nonobstructed gas pattern. No radiopaque calculi. IMPRESSION: Nonobstructed bowel-gas pattern Electronically Signed   By: Donavan Foil M.D.   On: 06/06/2018 00:21     EKG:  Not done in ED, will get one.   Assessment/Plan Principal Problem:   Chemical colitis Active Problems:   Rectal bleeding   Normocytic anemia   GERD (gastroesophageal reflux disease)   Chemical colitis and rectal bleeding: Hgb 11.2 on 05/21/2014-->11.8-->10.9, slightly dropped.  Currently hemodynamically stable.  Patient reports severe abdominal pain, but on physical examination, her  abdomen is very soft, no acute abdomen.  -Placed on MedSurg bed for observation - NPO - IVF: 1L NS bolus, then at 125 mL/hr - Start IV pantoprazole IV, 40 mg bid - Zofran IV for nausea - Avoid NSAIDs and SQ heparin - Maintain IV access (2 large bore IVs if possible). - Monitor closely and follow q6h cbc, transfuse as necessary, if Hgb<7.0 - LaB: INR, PTT and type screen - may need to discuss with GI in AM  Normocytic anemia: -see above  GERD (gastroesophageal reflux disease): -on IV protonix   DVT ppx: SCD Code Status: Full code Family Communication: None at bed side. Disposition Plan:  Anticipate discharge back to previous home environment Consults called:  none Admission status:  medical floor/obs       Date of Service 06/06/2018    [facility]   If 7PM-7AM, please contact night-coverage [email or web address] Password [facility] 06/06/2018, 3:51 AM

## 2018-06-07 ENCOUNTER — Inpatient Hospital Stay (HOSPITAL_COMMUNITY): Payer: Medicaid Other

## 2018-06-07 DIAGNOSIS — Z91040 Latex allergy status: Secondary | ICD-10-CM | POA: Diagnosis not present

## 2018-06-07 DIAGNOSIS — Z886 Allergy status to analgesic agent status: Secondary | ICD-10-CM | POA: Diagnosis not present

## 2018-06-07 DIAGNOSIS — K5901 Slow transit constipation: Secondary | ICD-10-CM | POA: Diagnosis not present

## 2018-06-07 DIAGNOSIS — Z880 Allergy status to penicillin: Secondary | ICD-10-CM | POA: Diagnosis not present

## 2018-06-07 DIAGNOSIS — Z79899 Other long term (current) drug therapy: Secondary | ICD-10-CM | POA: Diagnosis not present

## 2018-06-07 DIAGNOSIS — R1084 Generalized abdominal pain: Secondary | ICD-10-CM | POA: Diagnosis not present

## 2018-06-07 DIAGNOSIS — Z87891 Personal history of nicotine dependence: Secondary | ICD-10-CM | POA: Diagnosis not present

## 2018-06-07 DIAGNOSIS — T490X1A Poisoning by local antifungal, anti-infective and anti-inflammatory drugs, accidental (unintentional), initial encounter: Secondary | ICD-10-CM | POA: Diagnosis present

## 2018-06-07 DIAGNOSIS — K921 Melena: Secondary | ICD-10-CM | POA: Diagnosis not present

## 2018-06-07 DIAGNOSIS — D649 Anemia, unspecified: Secondary | ICD-10-CM | POA: Diagnosis present

## 2018-06-07 DIAGNOSIS — K625 Hemorrhage of anus and rectum: Secondary | ICD-10-CM | POA: Diagnosis not present

## 2018-06-07 DIAGNOSIS — K219 Gastro-esophageal reflux disease without esophagitis: Secondary | ICD-10-CM | POA: Diagnosis present

## 2018-06-07 DIAGNOSIS — K521 Toxic gastroenteritis and colitis: Secondary | ICD-10-CM | POA: Diagnosis not present

## 2018-06-07 DIAGNOSIS — Y92009 Unspecified place in unspecified non-institutional (private) residence as the place of occurrence of the external cause: Secondary | ICD-10-CM | POA: Diagnosis not present

## 2018-06-07 LAB — CBC WITH DIFFERENTIAL/PLATELET
Abs Immature Granulocytes: 0.12 10*3/uL — ABNORMAL HIGH (ref 0.00–0.07)
BASOS ABS: 0 10*3/uL (ref 0.0–0.1)
Basophils Relative: 0 %
Eosinophils Absolute: 0 10*3/uL (ref 0.0–0.5)
Eosinophils Relative: 0 %
HCT: 31.5 % — ABNORMAL LOW (ref 36.0–46.0)
Hemoglobin: 10 g/dL — ABNORMAL LOW (ref 12.0–15.0)
Immature Granulocytes: 1 %
Lymphocytes Relative: 12 %
Lymphs Abs: 1.8 10*3/uL (ref 0.7–4.0)
MCH: 28 pg (ref 26.0–34.0)
MCHC: 31.7 g/dL (ref 30.0–36.0)
MCV: 88.2 fL (ref 80.0–100.0)
Monocytes Absolute: 1.1 10*3/uL — ABNORMAL HIGH (ref 0.1–1.0)
Monocytes Relative: 7 %
NRBC: 0 % (ref 0.0–0.2)
Neutro Abs: 11.8 10*3/uL — ABNORMAL HIGH (ref 1.7–7.7)
Neutrophils Relative %: 80 %
Platelets: 176 10*3/uL (ref 150–400)
RBC: 3.57 MIL/uL — ABNORMAL LOW (ref 3.87–5.11)
RDW: 14.6 % (ref 11.5–15.5)
WBC: 14.8 10*3/uL — AB (ref 4.0–10.5)

## 2018-06-07 MED ORDER — SODIUM CHLORIDE (PF) 0.9 % IJ SOLN
INTRAMUSCULAR | Status: AC
  Filled 2018-06-07: qty 50

## 2018-06-07 MED ORDER — IOHEXOL 300 MG/ML  SOLN
30.0000 mL | Freq: Once | INTRAMUSCULAR | Status: AC | PRN
  Administered 2018-06-07: 30 mL via ORAL

## 2018-06-07 MED ORDER — IOHEXOL 300 MG/ML  SOLN
100.0000 mL | Freq: Once | INTRAMUSCULAR | Status: AC | PRN
  Administered 2018-06-07: 100 mL via INTRAVENOUS

## 2018-06-07 NOTE — Progress Notes (Signed)
Patient ID: Bailey Martin, female   DOB: Apr 29, 1985, 33 y.o.   MRN: [id number]  BRIEF GI NOTE: Dr. Henrene Pastor recommended abd/pelvic CT with oral/IV contrast. Patient denies ever having a CT therefore no known allergy to IV contrast has been established. LMP 05/22/2018. Patient denies chance of pregnancy. She declines HCG testing. I discussed CT scan cannot be done if there is any chance of pregnancy. She agrees to sign the CT radiology waiver before proceeding with the abd/pelvic CT. I contacted radiology, I spoke to Anderson, she will provide CT waiver for for patient to sign. I explained this process to the patient, discussed these measures are taken to ensure safe care.

## 2018-06-07 NOTE — Progress Notes (Addendum)
Progress Note   Subjective  Chief Complaint: Hematochezia and Abdominal pain following at home hydrogen peroxide enema  This morning patient tells me she continues with nausea and has had some episodes of vomiting if she tries to eat anything.  For this reason she is avoiding food.  Also continues with abdominal pain though tells me it is maybe slightly better than yesterday rated as a 6-7/10 but still requiring pain medication.  Per nursing she did run a low-grade fever around 99 earlier this morning around 5:00.  Patient has continued with at least 4 loose, "more just blood than anything else" bowel movements with cramping overnight, with the last just an hour or so ago.  Patient tells me she is having a stool she gets "chills and the pain worsens".   Objective   Vital signs in last 24 hours: Temp:  [98.9 F (37.2 C)-99.4 F (37.4 C)] 99.4 F (37.4 C) (03/05 0538) Pulse Rate:  [52-69] 69 (03/05 0538) Resp:  [16] 16 (03/05 0538) BP: (114-117)/(67-78) 114/78 (03/05 0538) SpO2:  [100 %] 100 % (03/05 0538) Last BM Date: 06/06/18 General:    AA female in NAD Heart:  Regular rate and rhythm; no murmurs Lungs: Respirations even and unlabored, lungs CTA bilaterally Abdomen:  Soft, moderate generalized ttp and nondistended. Normal bowel sounds. Extremities:  Without edema. Neurologic:  Alert and oriented,  grossly normal neurologically. Psych:  Cooperative. Normal mood and affect.  Intake/Output from previous day: 03/04 0701 - 03/05 0700 In: 3329.7 [P.O.:360; I.V.:2969.7] Out: 1530 [Urine:1525; Emesis/NG output:3; Stool:2]  Lab Results: Recent Labs    06/06/18 0236 06/06/18 0502 06/07/18 0437  WBC 8.5 8.5 14.8*  HGB 11.5* 10.9* 10.0*  HCT 37.0 35.4* 31.5*  PLT 167 222 176   BMET Recent Labs    06/05/18 2125 06/06/18 0502  NA 136 137  K 3.7 3.7  CL 102 107  CO2 27 22  GLUCOSE 119* 92  BUN 10 9  CREATININE 0.63 0.61  CALCIUM 9.4 8.5*   LFT Recent Labs   06/05/18 2125  PROT 7.7  ALBUMIN 4.5  AST 21  ALT 20  ALKPHOS 59  BILITOT 1.2   PT/INR Recent Labs    06/06/18 0236  LABPROT 13.3  INR 1.0    Studies/Results: Dg Abd 2 Views  Result Date: 06/06/2018 CLINICAL DATA:  Vomiting and fever EXAM: ABDOMEN - 2 VIEW COMPARISON:  None. FINDINGS: Lung bases are clear. No free air beneath the diaphragm. Overall nonobstructed gas pattern. No radiopaque calculi. IMPRESSION: Nonobstructed bowel-gas pattern Electronically Signed   By: Donavan Foil M.D.   On: 06/06/2018 00:21    Assessment / Plan:   Assessment: 1.  Hematochezia: All started after homemade enema with hydrogen peroxide and water, 14 episodes in total prior to admission, far more overnight, continues with mostly just bright red blood per rectum with occasional loose stool; likely chemical colitis 2.  Abdominal pain: Continues with generalized abdominal cramping resulting in loose bloody bowel movements overnight, some decrease in pain level 3.  Anemia: Likely related to above, 11.5-->10.9-->10 4.  Leukocytosis: 8.5-->14.8 overnight  Plan: 1.  Continue supportive measures 2.  Continue to monitor hemoglobin with transfusion as needed less than 7 3.  We will likely continue to monitor the patient, but will discuss further with Dr. Henrene Pastor 4.  Continue to allow clear liquids today 5.  Please await any further recommendations from Dr. Henrene Pastor later today  Thank for kind consultation.  We will continue to  follow.    LOS: 0 days   Levin Erp  06/07/2018, 9:00 AM  GI ATTENDING  Interval history data reviewed.  Patient personally seen and examined.  Patient continues to look well.  She does complain of lower abdominal discomfort on questioning.  Very mild tenderness on physical exam.  Still with bloody bowel movements.  Low-grade temperature and interval leukocytosis.  Given these changes, the concern would be bowel necrosis.  I think it would be best to proceed with CT scan of  the abdomen and pelvis at this time to assess.  May consider adding antibiotics if CT scan grossly abnormal.  Docia Chuck. Geri Semin[place]., M.D. [facility] Division of Gastroenterology

## 2018-06-07 NOTE — Progress Notes (Signed)
PROGRESS NOTE    Bailey Martin  HLK:[id number] DOB: 09-Nov-1985 DOA: 06/05/2018 PCP: Patient, No Pcp Per   Brief Narrative: Patient is a 33 year old female with history of GERD who presents to the emergency department with complaints of abdominal pain and rectal bleeding.  Reported that she treated herself with homemade hydrogen peroxide enema which she learned from YouTube to make the formula. After getting the enema, she started beginning feeling dizzy and sweaty and had watery diarrhea which then became bloody for at least 7 times.  Also started having abdominal pain. Patient admitted for monitoring.  GI following.  Plan is to CT abdomen/pelvis today.  Assessment & Plan:   Principal Problem:   Chemical colitis Active Problems:   Rectal bleeding   Normocytic anemia   GERD (gastroesophageal reflux disease)   Hematochezia   Slow transit constipation   Chemical colitis/rectal bleeding: Hemoglobin currently stable. Still having abdominal pain.  Had 2 bowel movements this morning which were bloody but not with significant volume. Had mild elevation of white cell counts.  GI concern about bowel necrosis.  Undergoing CT abdomen/pelvis today. We will check CBC tomorrow.  If abdomen CT comes out to be abnormal, will consider starting antibiotics.  Acute on chronic normocytic anemia: We will continue to monitor CBC.  Hemoglobin dropped to 10 today.  FOBT positive  GERD: Continue Protonix          DVT prophylaxis: SCD Code Status: Full Family Communication: None present at the bedside Disposition Plan: Likely home tomorrow   Consultants: GI  Procedures: None  Antimicrobials:  Anti-infectives (From admission, onward)   None      Subjective: Patient seen and examined the bedside this morning.  Hemodynamically stable.  Still complains of abdominal pain.  Had 2 bloody bowel movements this morning.  Objective: Vitals:   06/06/18 1420 06/06/18 2139 06/07/18 0538  06/07/18 1412  BP: 117/67 116/68 114/78 109/70  Pulse: (!) 52 (!) 53 69 (!) 55  Resp: 16 16 16 18   Temp: 98.9 F (37.2 C) 99.4 F (37.4 C) 99.4 F (37.4 C) 99.3 F (37.4 C)  TempSrc: Oral Oral Oral Oral  SpO2: 100% 100% 100% 100%  Weight:      Height:        Intake/Output Summary (Last 24 hours) at 06/07/2018 1441 Last data filed at 06/07/2018 1436 Gross per 24 hour  Intake 2181.55 ml  Output 1780 ml  Net 401.55 ml   Filed Weights   06/05/18 2103  Weight: 71.7 kg    Examination:  General exam: Appears calm and comfortable ,Not in distress,average built HEENT:PERRL,Oral mucosa moist, Ear/Nose normal on gross exam Respiratory system: Bilateral equal air entry, normal vesicular breath sounds, no wheezes or crackles  Cardiovascular system: S1 & S2 heard, RRR. No JVD, murmurs, rubs, gallops or clicks. No pedal edema. Gastrointestinal system: Abdomen is nondistended, soft .Mild generalised tenderness.  No organomegaly or masses felt. Normal bowel sounds heard. Central nervous system: Alert and oriented. No focal neurological deficits. Extremities: No edema, no clubbing ,no cyanosis, distal peripheral pulses palpable. Skin: No rashes, lesions or ulcers,no icterus ,no pallor MSK: Normal muscle bulk,tone ,power Psychiatry: Judgement and insight appear normal. Mood & affect appropriate.     Data Reviewed: I have personally reviewed following labs and imaging studies  CBC: Recent Labs  Lab 06/05/18 2125 06/06/18 0236 06/06/18 0502 06/07/18 0437  WBC 10.3 8.5 8.5 14.8*  NEUTROABS  --   --   --  11.8*  HGB 11.8* 11.5*  10.9* 10.0*  HCT 37.3 37.0 35.4* 31.5*  MCV 87.4 88.7 88.1 88.2  PLT 240 167 222 546   Basic Metabolic Panel: Recent Labs  Lab 06/05/18 2125 06/06/18 0502  NA 136 137  K 3.7 3.7  CL 102 107  CO2 27 22  GLUCOSE 119* 92  BUN 10 9  CREATININE 0.63 0.61  CALCIUM 9.4 8.5*   GFR: Estimated Creatinine Clearance: 100.9 mL/min (by C-G formula based on SCr  of 0.61 mg/dL). Liver Function Tests: Recent Labs  Lab 06/05/18 2125  AST 21  ALT 20  ALKPHOS 59  BILITOT 1.2  PROT 7.7  ALBUMIN 4.5   Recent Labs  Lab 06/05/18 2125  LIPASE 29   No results for input(s): AMMONIA in the last 168 hours. Coagulation Profile: Recent Labs  Lab 06/06/18 0236  INR 1.0   Cardiac Enzymes: No results for input(s): CKTOTAL, CKMB, CKMBINDEX, TROPONINI in the last 168 hours. BNP (last 3 results) No results for input(s): PROBNP in the last 8760 hours. HbA1C: No results for input(s): HGBA1C in the last 72 hours. CBG: No results for input(s): GLUCAP in the last 168 hours. Lipid Profile: No results for input(s): CHOL, HDL, LDLCALC, TRIG, CHOLHDL, LDLDIRECT in the last 72 hours. Thyroid Function Tests: No results for input(s): TSH, T4TOTAL, FREET4, T3FREE, THYROIDAB in the last 72 hours. Anemia Panel: No results for input(s): VITAMINB12, FOLATE, FERRITIN, TIBC, IRON, RETICCTPCT in the last 72 hours. Sepsis Labs: No results for input(s): PROCALCITON, LATICACIDVEN in the last 168 hours.  No results found for this or any previous visit (from the past 240 hour(s)).       Radiology Studies: Dg Abd 2 Views  Result Date: 06/06/2018 CLINICAL DATA:  Vomiting and fever EXAM: ABDOMEN - 2 VIEW COMPARISON:  None. FINDINGS: Lung bases are clear. No free air beneath the diaphragm. Overall nonobstructed gas pattern. No radiopaque calculi. IMPRESSION: Nonobstructed bowel-gas pattern Electronically Signed   By: Donavan Foil M.D.   On: 06/06/2018 00:21        Scheduled Meds: . acidophilus   Oral QODAY  . amphetamine-dextroamphetamine  15 mg Oral BID WC  . pantoprazole  40 mg Intravenous Q12H  . sodium chloride flush  3 mL Intravenous Once   Continuous Infusions: . sodium chloride 125 mL/hr at 06/07/18 1428     LOS: 0 days    Time spent: 25 mins.More than 50% of that time was spent in counseling and/or coordination of care.      Shelly Coss,  MD [facility] Pager [phone]  If 7PM-7AM, please contact night-coverage [email or web address] Password [facility] 06/07/2018, 2:41 PM

## 2018-06-08 DIAGNOSIS — R1084 Generalized abdominal pain: Secondary | ICD-10-CM

## 2018-06-08 LAB — CBC WITH DIFFERENTIAL/PLATELET
Abs Immature Granulocytes: 0.05 10*3/uL (ref 0.00–0.07)
Basophils Absolute: 0 10*3/uL (ref 0.0–0.1)
Basophils Relative: 0 %
Eosinophils Absolute: 0.1 10*3/uL (ref 0.0–0.5)
Eosinophils Relative: 1 %
HEMATOCRIT: 30.9 % — AB (ref 36.0–46.0)
Hemoglobin: 9.6 g/dL — ABNORMAL LOW (ref 12.0–15.0)
IMMATURE GRANULOCYTES: 1 %
LYMPHS ABS: 1.8 10*3/uL (ref 0.7–4.0)
Lymphocytes Relative: 18 %
MCH: 27.7 pg (ref 26.0–34.0)
MCHC: 31.1 g/dL (ref 30.0–36.0)
MCV: 89 fL (ref 80.0–100.0)
Monocytes Absolute: 0.7 10*3/uL (ref 0.1–1.0)
Monocytes Relative: 7 %
Neutro Abs: 7.6 10*3/uL (ref 1.7–7.7)
Neutrophils Relative %: 73 %
Platelets: 166 10*3/uL (ref 150–400)
RBC: 3.47 MIL/uL — ABNORMAL LOW (ref 3.87–5.11)
RDW: 14.8 % (ref 11.5–15.5)
WBC: 10.4 10*3/uL (ref 4.0–10.5)
nRBC: 0 % (ref 0.0–0.2)

## 2018-06-08 MED ORDER — HYDROCORTISONE 1 % EX CREA
1.0000 "application " | TOPICAL_CREAM | Freq: Three times a day (TID) | CUTANEOUS | Status: DC | PRN
  Filled 2018-06-08: qty 28

## 2018-06-08 NOTE — Progress Notes (Signed)
PROGRESS NOTE    Bailey Martin  LTJ:[id number] DOB: Nov 01, 1985 DOA: 06/05/2018 PCP: Patient, No Pcp Per   Brief Narrative: Patient is a 33 year old female with history of GERD who presents to the emergency department with complaints of abdominal pain and rectal bleeding.  Reported that she treated herself with homemade hydrogen peroxide enema which she learned from YouTube to make the formula. After getting the enema, she started beginning feeling dizzy and sweaty and had watery diarrhea which then became bloody for at least 7 times.  Also started having abdominal pain. Patient admitted for monitoring.  GI following. CT abdomen/pelvis showed extensive inflammatory colitis extending right transverse colon to rectum.  No perforation or abscess.  Diet advanced today.  Abdominal pain better.  Assessment & Plan:   Principal Problem:   Chemical colitis Active Problems:   Rectal bleeding   Normocytic anemia   GERD (gastroesophageal reflux disease)   Hematochezia   Slow transit constipation   Chemical colitis/rectal bleeding: Hemoglobin currently stable. Abdominal pain has improved.  Had 2 bowel movements this morning  which were bloody but not with significant volume. Normal white cell counts today. We will check CBC tomorrow. Diet will be advanced today.  Acute on chronic normocytic anemia: We will continue to monitor CBC.  Hemoglobin dropped to 9.6 but stable.  GERD: Continue Protonix          DVT prophylaxis: SCD Code Status: Full Family Communication: None present at the bedside Disposition Plan: Likely home tomorrow   Consultants: GI  Procedures: None  Antimicrobials:  Anti-infectives (From admission, onward)   None      Subjective: Patient seen and examined the bedside this morning.  Hemodynamically stable.  Looks comfortable.  Abdominal pain is still there but has improved since yesterday.  Had 2 bowel movements which were bloody but not  severe like  before.  Objective: Vitals:   06/07/18 0538 06/07/18 1412 06/07/18 2159 06/08/18 0605  BP: 114/78 109/70 108/75 106/70  Pulse: 69 (!) 55 63 (!) 56  Resp: 16 18 16 16   Temp: 99.4 F (37.4 C) 99.3 F (37.4 C) 99.5 F (37.5 C) 98.7 F (37.1 C)  TempSrc: Oral Oral Oral Oral  SpO2: 100% 100% 100% 100%  Weight:      Height:        Intake/Output Summary (Last 24 hours) at 06/08/2018 1132 Last data filed at 06/08/2018 0762 Gross per 24 hour  Intake 2249.88 ml  Output 3100 ml  Net -850.12 ml   Filed Weights   06/05/18 2103  Weight: 71.7 kg    Examination:  General exam: Appears calm and comfortable ,Not in distress,average built HEENT:PERRL,Oral mucosa moist, Ear/Nose normal on gross exam Respiratory system: Bilateral equal air entry, normal vesicular breath sounds, no wheezes or crackles  Cardiovascular system: S1 & S2 heard, RRR. No JVD, murmurs, rubs, gallops or clicks. Gastrointestinal system: Abdomen is nondistended, soft and nontender. No organomegaly or masses felt. Normal bowel sounds heard. Mild generalized tenderness Central nervous system: Alert and oriented. No focal neurological deficits. Extremities: No edema, no clubbing ,no cyanosis, distal peripheral pulses palpable. Skin: No rashes, lesions or ulcers,no icterus ,no pallor  Data Reviewed: I have personally reviewed following labs and imaging studies  CBC: Recent Labs  Lab 06/05/18 2125 06/06/18 0236 06/06/18 0502 06/07/18 0437 06/08/18 0425  WBC 10.3 8.5 8.5 14.8* 10.4  NEUTROABS  --   --   --  11.8* 7.6  HGB 11.8* 11.5* 10.9* 10.0* 9.6*  HCT 37.3 37.0  35.4* 31.5* 30.9*  MCV 87.4 88.7 88.1 88.2 89.0  PLT 240 167 222 176 010   Basic Metabolic Panel: Recent Labs  Lab 06/05/18 2125 06/06/18 0502  NA 136 137  K 3.7 3.7  CL 102 107  CO2 27 22  GLUCOSE 119* 92  BUN 10 9  CREATININE 0.63 0.61  CALCIUM 9.4 8.5*   GFR: Estimated Creatinine Clearance: 100.9 mL/min (by C-G formula based on SCr of  0.61 mg/dL). Liver Function Tests: Recent Labs  Lab 06/05/18 2125  AST 21  ALT 20  ALKPHOS 59  BILITOT 1.2  PROT 7.7  ALBUMIN 4.5   Recent Labs  Lab 06/05/18 2125  LIPASE 29   No results for input(s): AMMONIA in the last 168 hours. Coagulation Profile: Recent Labs  Lab 06/06/18 0236  INR 1.0   Cardiac Enzymes: No results for input(s): CKTOTAL, CKMB, CKMBINDEX, TROPONINI in the last 168 hours. BNP (last 3 results) No results for input(s): PROBNP in the last 8760 hours. HbA1C: No results for input(s): HGBA1C in the last 72 hours. CBG: No results for input(s): GLUCAP in the last 168 hours. Lipid Profile: No results for input(s): CHOL, HDL, LDLCALC, TRIG, CHOLHDL, LDLDIRECT in the last 72 hours. Thyroid Function Tests: No results for input(s): TSH, T4TOTAL, FREET4, T3FREE, THYROIDAB in the last 72 hours. Anemia Panel: No results for input(s): VITAMINB12, FOLATE, FERRITIN, TIBC, IRON, RETICCTPCT in the last 72 hours. Sepsis Labs: No results for input(s): PROCALCITON, LATICACIDVEN in the last 168 hours.  No results found for this or any previous visit (from the past 240 hour(s)).       Radiology Studies: Ct Abdomen Pelvis W Contrast  Result Date: 06/07/2018 CLINICAL DATA:  Patient is having generalized abdominal pain with rectal bleeding;History of constipation, used hydroperoxide for an enema per patient "burned the inside of her colon" EXAM: CT ABDOMEN AND PELVIS WITH CONTRAST TECHNIQUE: Multidetector CT imaging of the abdomen and pelvis was performed using the standard protocol following bolus administration of intravenous contrast. CONTRAST:  145mL OMNIPAQUE IOHEXOL 300 MG/ML SOLN, 71mL OMNIPAQUE IOHEXOL 300 MG/ML SOLN COMPARISON:  Abdominal radiograph, 06/05/2018 FINDINGS: Lower chest: Clear lung bases.  Heart normal in size. Hepatobiliary: No focal liver abnormality is seen. No gallstones, gallbladder wall thickening, or biliary dilatation. Pancreas: Unremarkable. No  pancreatic ductal dilatation or surrounding inflammatory changes. Spleen: Normal in size without focal abnormality. Adrenals/Urinary Tract: Adrenal glands are unremarkable. Kidneys are normal, without renal calculi, focal lesion, or hydronephrosis. Bladder is unremarkable. Stomach/Bowel: There is wall thickening, up to 9 mm, from the rectum through the sigmoid and descending colon and across the transverse colon. Transverse colon lies low in the abdomen. Colonic wall thickening terminates in the right transverse colon, which lies in the right lower quadrant. Colon proximal to this is redundant, extending into the right upper abdomen then descending inferiorly into the pelvis. No other colonic wall thickening or inflammation. Normal appendix visualized. Stomach and small bowel are unremarkable. There is no abscess. There is no extraluminal air to suggest a perforation. Vascular/Lymphatic: No significant vascular findings are present. No enlarged abdominal or pelvic lymph nodes. Reproductive: Uterus and bilateral adnexa are unremarkable. Other: Trace amount of ascites is noted adjacent to the inflamed colon and along the inferior margin of the liver as well as collecting in the posterior pelvic recess. Musculoskeletal: No fracture or acute finding. No osteoblastic or osteolytic lesions. IMPRESSION: 1. Extensive infectious or inflammatory colitis extending from the right transverse colon to the rectum. There is no  evidence of colonic perforation and no abscess. There is a trace amount of associated ascites. 2. No other acute abnormality within the abdomen or pelvis. Electronically Signed   By: Lajean Manes M.D.   On: 06/07/2018 19:01        Scheduled Meds: . acidophilus   Oral QODAY  . amphetamine-dextroamphetamine  15 mg Oral BID WC  . pantoprazole  40 mg Intravenous Q12H  . sodium chloride flush  3 mL Intravenous Once   Continuous Infusions: . sodium chloride 100 mL/hr at 06/08/18 0202     LOS: 1  day    Time spent: 25 mins.More than 50% of that time was spent in counseling and/or coordination of care.      Shelly Coss, MD [facility] Pager [phone]  If 7PM-7AM, please contact night-coverage [email or web address] Password [facility] 06/08/2018, 11:32 AM

## 2018-06-08 NOTE — Progress Notes (Addendum)
Progress Note   Subjective  Chief Complaint: Hematochezia and abdominal pain following at home hydrogen peroxide enema  This morning patient tells me that her pain is maybe a 4-5/10 and is improving overnight.  She had a total of 5 loose stools yesterday with "slightly less blood than before".  Has been tolerating a clear liquid diet and asked about advancement today if "it won't hurt anything".  Overall she is slowly improving.   Objective   Vital signs in last 24 hours: Temp:  [98.7 F (37.1 C)-99.5 F (37.5 C)] 98.7 F (37.1 C) (03/06 0605) Pulse Rate:  [55-63] 56 (03/06 0605) Resp:  [16-18] 16 (03/06 0605) BP: (106-109)/(70-75) 106/70 (03/06 0605) SpO2:  [100 %] 100 % (03/06 0605) Last BM Date: 06/08/18 General:  AA female in NAD Heart:  Regular rate and rhythm; no murmurs Lungs: Respirations even and unlabored, lungs CTA bilaterally Abdomen:  Soft, mild generalized ttp (improved) and nondistended. Normal bowel sounds. Extremities:  Without edema. Neurologic:  Alert and oriented,  grossly normal neurologically. Psych:  Cooperative. Normal mood and affect.  Intake/Output from previous day: 03/05 0701 - 03/06 0700 In: 2539.9 [P.O.:590; I.V.:1949.9] Out: 3000 [Urine:3000]  Lab Results: Recent Labs    06/06/18 0502 06/07/18 0437 06/08/18 0425  WBC 8.5 14.8* 10.4  HGB 10.9* 10.0* 9.6*  HCT 35.4* 31.5* 30.9*  PLT 222 176 166   BMET Recent Labs    06/05/18 2125 06/06/18 0502  NA 136 137  K 3.7 3.7  CL 102 107  CO2 27 22  GLUCOSE 119* 92  BUN 10 9  CREATININE 0.63 0.61  CALCIUM 9.4 8.5*   LFT Recent Labs    06/05/18 2125  PROT 7.7  ALBUMIN 4.5  AST 21  ALT 20  ALKPHOS 59  BILITOT 1.2   PT/INR Recent Labs    06/06/18 0236  LABPROT 13.3  INR 1.0    Studies/Results: Ct Abdomen Pelvis W Contrast  Result Date: 06/07/2018 CLINICAL DATA:  Patient is having generalized abdominal pain with rectal bleeding;History of constipation, used  hydroperoxide for an enema per patient "burned the inside of her colon" EXAM: CT ABDOMEN AND PELVIS WITH CONTRAST TECHNIQUE: Multidetector CT imaging of the abdomen and pelvis was performed using the standard protocol following bolus administration of intravenous contrast. CONTRAST:  12mL OMNIPAQUE IOHEXOL 300 MG/ML SOLN, 23mL OMNIPAQUE IOHEXOL 300 MG/ML SOLN COMPARISON:  Abdominal radiograph, 06/05/2018 FINDINGS: Lower chest: Clear lung bases.  Heart normal in size. Hepatobiliary: No focal liver abnormality is seen. No gallstones, gallbladder wall thickening, or biliary dilatation. Pancreas: Unremarkable. No pancreatic ductal dilatation or surrounding inflammatory changes. Spleen: Normal in size without focal abnormality. Adrenals/Urinary Tract: Adrenal glands are unremarkable. Kidneys are normal, without renal calculi, focal lesion, or hydronephrosis. Bladder is unremarkable. Stomach/Bowel: There is wall thickening, up to 9 mm, from the rectum through the sigmoid and descending colon and across the transverse colon. Transverse colon lies low in the abdomen. Colonic wall thickening terminates in the right transverse colon, which lies in the right lower quadrant. Colon proximal to this is redundant, extending into the right upper abdomen then descending inferiorly into the pelvis. No other colonic wall thickening or inflammation. Normal appendix visualized. Stomach and small bowel are unremarkable. There is no abscess. There is no extraluminal air to suggest a perforation. Vascular/Lymphatic: No significant vascular findings are present. No enlarged abdominal or pelvic lymph nodes. Reproductive: Uterus and bilateral adnexa are unremarkable. Other: Trace amount of ascites is noted adjacent to the  inflamed colon and along the inferior margin of the liver as well as collecting in the posterior pelvic recess. Musculoskeletal: No fracture or acute finding. No osteoblastic or osteolytic lesions. IMPRESSION: 1. Extensive  infectious or inflammatory colitis extending from the right transverse colon to the rectum. There is no evidence of colonic perforation and no abscess. There is a trace amount of associated ascites. 2. No other acute abnormality within the abdomen or pelvis. Electronically Signed   By: Lajean Manes M.D.   On: 06/07/2018 19:01    Assessment / Plan:    Assessment: 1.  Hematochezia: After homemade enema with hydrogen peroxide and water, decreasing in amount, only 5 yesterday with decreasing bright red blood, CT as above with inflammatory colitis 2.  Abdominal pain: Improving 3.  Anemia: 11.5--> 10.9--> 10--> 9.6 4.  Leukocytosis: Normalized today  Plan: 1.  Continue current supportive measures 2.  Increased diet to soft, please allow patient to ambulate the halls today and see how she feels. 3.  Please await any further recommendations from Dr. Henrene Pastor.  Thank you for your kind consultation.   LOS: 1 day   Levin Erp  06/08/2018, 9:14 AM  GI ATTENDING  Interval history data reviewed.  Patient seen and examined.  Agree with interval progress note.  Clinically the patient is doing better.  No significant fever.  Leukocytosis has resolved.  CT scan shows significant chemical colitis.  Abdominal exam improving.  Patient with improved appetite.  Agree with continued observation with advancing diet.  Monitor abdominal exam and stools.  Docia Chuck. Geri [place]in[place]., M.D. [facility] Division of Gastroenterology

## 2018-06-09 LAB — CBC WITH DIFFERENTIAL/PLATELET
Abs Immature Granulocytes: 0.02 10*3/uL (ref 0.00–0.07)
Basophils Absolute: 0 10*3/uL (ref 0.0–0.1)
Basophils Relative: 1 %
Eosinophils Absolute: 0.1 10*3/uL (ref 0.0–0.5)
Eosinophils Relative: 1 %
HCT: 31.4 % — ABNORMAL LOW (ref 36.0–46.0)
Hemoglobin: 9.7 g/dL — ABNORMAL LOW (ref 12.0–15.0)
Immature Granulocytes: 0 %
Lymphocytes Relative: 15 %
Lymphs Abs: 1.2 10*3/uL (ref 0.7–4.0)
MCH: 27.5 pg (ref 26.0–34.0)
MCHC: 30.9 g/dL (ref 30.0–36.0)
MCV: 89 fL (ref 80.0–100.0)
Monocytes Absolute: 0.7 10*3/uL (ref 0.1–1.0)
Monocytes Relative: 8 %
Neutro Abs: 6.2 10*3/uL (ref 1.7–7.7)
Neutrophils Relative %: 75 %
Platelets: 191 10*3/uL (ref 150–400)
RBC: 3.53 MIL/uL — AB (ref 3.87–5.11)
RDW: 14.6 % (ref 11.5–15.5)
WBC: 8.2 10*3/uL (ref 4.0–10.5)
nRBC: 0 % (ref 0.0–0.2)

## 2018-06-09 MED ORDER — POLYETHYLENE GLYCOL 3350 17 G PO PACK: 17.0000 g | PACK | Freq: Every day | ORAL | 0 refills | Status: DC | PRN

## 2018-06-09 MED ORDER — PANTOPRAZOLE SODIUM 40 MG PO TBEC: 40.0000 mg | DELAYED_RELEASE_TABLET | Freq: Every day | ORAL | 0 refills | Status: DC

## 2018-06-09 NOTE — Discharge Summary (Signed)
Physician Discharge Summary  Bailey Martin VWU:[id number] DOB: 08/07/1985 DOA: 06/05/2018  PCP: Patient, No Pcp Per  Admit date: 06/05/2018 Discharge date: 06/09/2018  Admitted From: Home Disposition:  Home  Discharge Condition:Stable CODE STATUS:FULL Diet recommendation:Soft diet for next 2-3 days  Brief/Interim Summary: Patient is a 33 year old female with history of GERD who presents to the emergency department with complaints of abdominal pain and rectal bleeding. Reported that she treated herself for constipation with homemade hydrogen peroxide enema which she learned from YouTube to make the formula. After getting the enema, she started beginning feeling dizzy and sweaty and had watery diarrhea which then became bloody for at least 7 times. Also started having abdominal pain. Patient admitted for monitoring. GI following. CT abdomen/pelvis showed extensive inflammatory colitis extending right transverse colon to rectum.  No perforation or abscess.  Diet advanced to soft .  Abdominal pain better.  She had a bowel movement today with minimal bleeding mixed with the stool.  GI cleared her for discharge. She is hemodynamically stable for discharge to home today.  Following problems were addressed during hospitalization:  Chemical colitis/rectal bleeding: Hemoglobin currently stable. Abdominal pain has improved.  Had a bowel movement this morning  which were bloody but not with significant volume. Normal white cell counts today. Tolerating soft diet.  Acute on chronic normocytic anemia:  Hemoglobin dropped  but stable.Can check CBC during follow up with her PCP.  GERD: Continue Protonix  Discharge Diagnoses:  Principal Problem:   Chemical colitis Active Problems:   Rectal bleeding   Normocytic anemia   GERD (gastroesophageal reflux disease)   Hematochezia   Slow transit constipation    Discharge Instructions  Discharge Instructions    Diet - low sodium heart healthy    Complete by:  As directed    Discharge instructions   Complete by:  As directed    1)Follow up with your PCP in a week. 2)Take soft diet for next 2-3 days.   Increase activity slowly   Complete by:  As directed      Allergies as of 06/09/2018      Reactions   Penicillins Anaphylaxis   Pt states that she has been allergic since childhood but does not know what the reaction is Did it involve swelling of the face/tongue/throat, SOB, or low BP? Yes Did it involve sudden or severe rash/hives, skin peeling, or any reaction on the inside of your mouth or nose? Yes Did you need to seek medical attention at a hospital or doctor's office? Yes When did it last happen?Childhood rxn If all above answers are "NO", may proceed with cephalosporin use.   Tylenol [acetaminophen] Anaphylaxis   Had rxn to liquid tylenol   Latex Itching, Swelling      Medication List    TAKE these medications   amphetamine-dextroamphetamine 15 MG tablet Commonly known as:  ADDERALL Take 1 tablet by mouth 2 (two) times daily.   polyethylene glycol packet Commonly known as:  MiraLax Take 17 g by mouth daily as needed.   PROBIOTIC PO Take 1 capsule by mouth every other day.       Allergies  Allergen Reactions  . Penicillins Anaphylaxis    Pt states that she has been allergic since childhood but does not know what the reaction is Did it involve swelling of the face/tongue/throat, SOB, or low BP? Yes Did it involve sudden or severe rash/hives, skin peeling, or any reaction on the inside of your mouth or nose? Yes Did you need  to seek medical attention at a hospital or doctor's office? Yes When did it last happen?Childhood rxn If all above answers are "NO", may proceed with cephalosporin use.  . Tylenol [Acetaminophen] Anaphylaxis    Had rxn to liquid tylenol  . Latex Itching and Swelling    Consultations:  GI   Procedures/Studies: Ct Abdomen Pelvis W Contrast  Result Date: 06/07/2018 CLINICAL  DATA:  Patient is having generalized abdominal pain with rectal bleeding;History of constipation, used hydroperoxide for an enema per patient "burned the inside of her colon" EXAM: CT ABDOMEN AND PELVIS WITH CONTRAST TECHNIQUE: Multidetector CT imaging of the abdomen and pelvis was performed using the standard protocol following bolus administration of intravenous contrast. CONTRAST:  169mL OMNIPAQUE IOHEXOL 300 MG/ML SOLN, 42mL OMNIPAQUE IOHEXOL 300 MG/ML SOLN COMPARISON:  Abdominal radiograph, 06/05/2018 FINDINGS: Lower chest: Clear lung bases.  Heart normal in size. Hepatobiliary: No focal liver abnormality is seen. No gallstones, gallbladder wall thickening, or biliary dilatation. Pancreas: Unremarkable. No pancreatic ductal dilatation or surrounding inflammatory changes. Spleen: Normal in size without focal abnormality. Adrenals/Urinary Tract: Adrenal glands are unremarkable. Kidneys are normal, without renal calculi, focal lesion, or hydronephrosis. Bladder is unremarkable. Stomach/Bowel: There is wall thickening, up to 9 mm, from the rectum through the sigmoid and descending colon and across the transverse colon. Transverse colon lies low in the abdomen. Colonic wall thickening terminates in the right transverse colon, which lies in the right lower quadrant. Colon proximal to this is redundant, extending into the right upper abdomen then descending inferiorly into the pelvis. No other colonic wall thickening or inflammation. Normal appendix visualized. Stomach and small bowel are unremarkable. There is no abscess. There is no extraluminal air to suggest a perforation. Vascular/Lymphatic: No significant vascular findings are present. No enlarged abdominal or pelvic lymph nodes. Reproductive: Uterus and bilateral adnexa are unremarkable. Other: Trace amount of ascites is noted adjacent to the inflamed colon and along the inferior margin of the liver as well as collecting in the posterior pelvic recess.  Musculoskeletal: No fracture or acute finding. No osteoblastic or osteolytic lesions. IMPRESSION: 1. Extensive infectious or inflammatory colitis extending from the right transverse colon to the rectum. There is no evidence of colonic perforation and no abscess. There is a trace amount of associated ascites. 2. No other acute abnormality within the abdomen or pelvis. Electronically Signed   By: Lajean Manes M.D.   On: 06/07/2018 19:01   Dg Abd 2 Views  Result Date: 06/06/2018 CLINICAL DATA:  Vomiting and fever EXAM: ABDOMEN - 2 VIEW COMPARISON:  None. FINDINGS: Lung bases are clear. No free air beneath the diaphragm. Overall nonobstructed gas pattern. No radiopaque calculi. IMPRESSION: Nonobstructed bowel-gas pattern Electronically Signed   By: Donavan Foil M.D.   On: 06/06/2018 00:21      Subjective: Patient seen and examined at bedside this morning.  Events comfortable.  Hemodynamically stable.  Abdomen pain has improved.  Stable for discharge today to home.  Discharge Exam: Vitals:   06/08/18 2204 06/09/18 0539  BP: 120/71 108/64  Pulse: 72 72  Resp: 16 16  Temp: 98.5 F (36.9 C) 98.8 F (37.1 C)  SpO2: 100% 99%   Vitals:   06/08/18 0605 06/08/18 1347 06/08/18 2204 06/09/18 0539  BP: 106/70 97/63 120/71 108/64  Pulse: (!) 56 62 72 72  Resp: 16 16 16 16   Temp: 98.7 F (37.1 C) 98.3 F (36.8 C) 98.5 F (36.9 C) 98.8 F (37.1 C)  TempSrc: Oral Oral Oral Oral  SpO2: 100% 98% 100% 99%  Weight:      Height:        General: Pt is alert, awake, not in acute distress Cardiovascular: RRR, S1/S2 +, no rubs, no gallops Respiratory: CTA bilaterally, no wheezing, no rhonchi Abdominal: Soft,  ND, bowel sounds +, mild generalized tenderness. Extremities: no edema, no cyanosis    The results of significant diagnostics from this hospitalization (including imaging, microbiology, ancillary and laboratory) are listed below for reference.     Microbiology: No results found for this  or any previous visit (from the past 240 hour(s)).   Labs: BNP (last 3 results) No results for input(s): BNP in the last 8760 hours. Basic Metabolic Panel: Recent Labs  Lab 06/05/18 2125 06/06/18 0502  NA 136 137  K 3.7 3.7  CL 102 107  CO2 27 22  GLUCOSE 119* 92  BUN 10 9  CREATININE 0.63 0.61  CALCIUM 9.4 8.5*   Liver Function Tests: Recent Labs  Lab 06/05/18 2125  AST 21  ALT 20  ALKPHOS 59  BILITOT 1.2  PROT 7.7  ALBUMIN 4.5   Recent Labs  Lab 06/05/18 2125  LIPASE 29   No results for input(s): AMMONIA in the last 168 hours. CBC: Recent Labs  Lab 06/06/18 0236 06/06/18 0502 06/07/18 0437 06/08/18 0425 06/09/18 0404  WBC 8.5 8.5 14.8* 10.4 8.2  NEUTROABS  --   --  11.8* 7.6 6.2  HGB 11.5* 10.9* 10.0* 9.6* 9.7*  HCT 37.0 35.4* 31.5* 30.9* 31.4*  MCV 88.7 88.1 88.2 89.0 89.0  PLT 167 222 176 166 191   Cardiac Enzymes: No results for input(s): CKTOTAL, CKMB, CKMBINDEX, TROPONINI in the last 168 hours. BNP: Invalid input(s): POCBNP CBG: No results for input(s): GLUCAP in the last 168 hours. D-Dimer No results for input(s): DDIMER in the last 72 hours. Hgb A1c No results for input(s): HGBA1C in the last 72 hours. Lipid Profile No results for input(s): CHOL, HDL, LDLCALC, TRIG, CHOLHDL, LDLDIRECT in the last 72 hours. Thyroid function studies No results for input(s): TSH, T4TOTAL, T3FREE, THYROIDAB in the last 72 hours.  Invalid input(s): FREET3 Anemia work up No results for input(s): VITAMINB12, FOLATE, FERRITIN, TIBC, IRON, RETICCTPCT in the last 72 hours. Urinalysis    Component Value Date/Time   COLORURINE YELLOW 06/05/2018 2125   APPEARANCEUR CLEAR 06/05/2018 2125   LABSPEC 1.011 06/05/2018 2125   PHURINE 7.0 06/05/2018 2125   GLUCOSEU NEGATIVE 06/05/2018 2125   HGBUR NEGATIVE 06/05/2018 2125   BILIRUBINUR NEGATIVE 06/05/2018 2125   KETONESUR 5 (A) 06/05/2018 2125   PROTEINUR NEGATIVE 06/05/2018 2125   UROBILINOGEN 0.2 07/25/2014 0355    NITRITE NEGATIVE 06/05/2018 2125   LEUKOCYTESUR NEGATIVE 06/05/2018 2125   Sepsis Labs Invalid input(s): PROCALCITONIN,  WBC,  LACTICIDVEN Microbiology No results found for this or any previous visit (from the past 240 hour(s)).  Please note: You were cared for by a hospitalist during your hospital stay. Once you are discharged, your primary care physician will handle any further medical issues. Please note that NO REFILLS for any discharge medications will be authorized once you are discharged, as it is imperative that you return to your primary care physician (or establish a relationship with a primary care physician if you do not have one) for your post hospital discharge needs so that they can reassess your need for medications and monitor your lab values.    Time coordinating discharge: 40 minutes  SIGNED:   Shelly Coss, MD  [facility]  06/09/2018, 10:54 AM Pager [phone]  If 7PM-7AM, please contact night-coverage [email or web address] Password [email or web address]

## 2018-06-09 NOTE — Progress Notes (Signed)
Assessment unchanged. Pt verbalized understanding of dc instructions through teach back including follow up care with PCP and when to call the doctor. No scripts. Discharged via wc to front entrance accompanied by NT and significant other.

## 2019-05-27 ENCOUNTER — Emergency Department (HOSPITAL_COMMUNITY)
Admission: EM | Admit: 2019-05-27 | Discharge: 2019-05-27 | Disposition: A | Discharge status: Home / Self Care | Payer: Medicaid Other | Attending: Emergency Medicine | Admitting: Emergency Medicine

## 2019-05-27 ENCOUNTER — Emergency Department (HOSPITAL_COMMUNITY): Payer: Medicaid Other

## 2019-05-27 ENCOUNTER — Encounter (HOSPITAL_COMMUNITY): Payer: Self-pay

## 2019-05-27 ENCOUNTER — Other Ambulatory Visit: Payer: Self-pay

## 2019-05-27 DIAGNOSIS — D251 Intramural leiomyoma of uterus: Secondary | ICD-10-CM

## 2019-05-27 DIAGNOSIS — Z87891 Personal history of nicotine dependence: Secondary | ICD-10-CM | POA: Diagnosis not present

## 2019-05-27 DIAGNOSIS — Z91040 Latex allergy status: Secondary | ICD-10-CM | POA: Insufficient documentation

## 2019-05-27 DIAGNOSIS — R102 Pelvic and perineal pain: Secondary | ICD-10-CM | POA: Insufficient documentation

## 2019-05-27 LAB — URINALYSIS, ROUTINE W REFLEX MICROSCOPIC
Bacteria, UA: NONE SEEN
Bilirubin Urine: NEGATIVE
Glucose, UA: NEGATIVE mg/dL
Ketones, ur: NEGATIVE mg/dL
Leukocytes,Ua: NEGATIVE
Nitrite: NEGATIVE
Protein, ur: NEGATIVE mg/dL
Specific Gravity, Urine: 1.014 (ref 1.005–1.030)
pH: 6 (ref 5.0–8.0)

## 2019-05-27 LAB — WET PREP, GENITAL
Clue Cells Wet Prep HPF POC: NONE SEEN
Sperm: NONE SEEN
Trich, Wet Prep: NONE SEEN
Yeast Wet Prep HPF POC: NONE SEEN

## 2019-05-27 LAB — PREGNANCY, URINE: Preg Test, Ur: NEGATIVE

## 2019-05-27 MED ORDER — PROBIOTIC PO CAPS: 1.0000 | ORAL_CAPSULE | Freq: Every day | ORAL | 1 refills | Status: DC

## 2019-05-27 MED ORDER — IBUPROFEN 800 MG PO TABS
800.0000 mg | ORAL_TABLET | Freq: Once | ORAL | Status: AC
  Administered 2019-05-27: 800 mg via ORAL
  Filled 2019-05-27: qty 1

## 2019-05-27 NOTE — ED Provider Notes (Signed)
TIME SEEN: 5:53 AM  CHIEF COMPLAINT: Lower abdominal pain, vaginal bleeding, vaginal pain  HPI: Patient is a 34 year old female with history of trichomonas in 2014 who presents to the emergency department with lower abdominal pain, intermittent vaginal bleeding and vaginal pain since she underwent vaginal steaming at a spa on February 5.  States that she sat over a pot of water that was plugged in and had herbs and flowers in it for an hour and 45 minutes.  States that she found out afterwards she was only supposed to sit over it for 10 minutes.  States immediately afterwards she did have some vaginal bleeding which she was told was normal by the staff at the spa.  She states prior to this her period had ended 4 days ago.  Since that time she has had intermittent vaginal bleeding and is bleeding today.  She did notice some clear discharge that has resolved.  No abnormal odor.  No dysuria.  No fevers.  Did have nausea yesterday that resolved.  No vomiting.  Has had some diarrhea.  States she was seen by her primary care physician and reports that she had a Pap smear and was checked for STDs.  States she is concerned that they did not check for everything.  States she was given antibiotics and a medication for yeast which she has completed without improvement of her symptoms.  Was told by her primary care doctor per her report that she may need a pelvic ultrasound.  She has never had issues with dysmenorrhea, irregular periods before.  She does not have an OB/GYN.  ROS: See HPI Constitutional: no fever  Eyes: no drainage  ENT: no runny nose   Cardiovascular:  no chest pain  Resp: no SOB  GI: no vomiting GU: no dysuria Integumentary: no rash  Allergy: no hives  Musculoskeletal: no leg swelling  Neurological: no slurred speech ROS otherwise negative  PAST MEDICAL HISTORY/PAST SURGICAL HISTORY:  Past Medical History:  Diagnosis Date  . GERD (gastroesophageal reflux disease)   . Trichomonal  vaginitis 2014  . Vaginal Pap smear, abnormal     MEDICATIONS:  Prior to Admission medications   Medication Sig Start Date End Date Taking? Authorizing Provider  amphetamine-dextroamphetamine (ADDERALL) 15 MG tablet Take 1 tablet by mouth 2 (two) times daily. 05/02/18   [provider]  pantoprazole (PROTONIX) 40 MG tablet Take 1 tablet (40 mg total) by mouth daily for 30 days. 06/09/18 07/09/18  Shelly Coss, MD  polyethylene glycol (MIRALAX) packet Take 17 g by mouth daily as needed. 06/09/18   Shelly Coss, MD  Probiotic Product (PROBIOTIC PO) Take 1 capsule by mouth every other day.    [provider]    ALLERGIES:  Allergies  Allergen Reactions  . Penicillins Anaphylaxis    Pt states that she has been allergic since childhood but does not know what the reaction is Did it involve swelling of the face/tongue/throat, SOB, or low BP? Yes Did it involve sudden or severe rash/hives, skin peeling, or any reaction on the inside of your mouth or nose? Yes Did you need to seek medical attention at a hospital or doctor's office? Yes When did it last happen?Childhood rxn If all above answers are "NO", may proceed with cephalosporin use.  . Tylenol [Acetaminophen] Anaphylaxis    Had rxn to liquid tylenol  . Latex Itching and Swelling    SOCIAL HISTORY:  Social History   Tobacco Use  . Smoking status: Former Research scientist (life sciences)  .  Smokeless tobacco: Never Used  Substance Use Topics  . Alcohol use: Yes    Comment: occassional when not pregnant    FAMILY HISTORY: Family History  Problem Relation Age of Onset  . Diabetes Maternal Grandmother     EXAM: BP (!) 118/104 (BP Location: Left Arm)   Pulse 87   Temp 98 F (36.7 C) (Oral)   Resp 16   SpO2 99%  CONSTITUTIONAL: Alert and oriented and responds appropriately to questions. Well-appearing; well-nourished, afebrile, nontoxic-appearing HEAD: Normocephalic EYES: Conjunctivae clear, pupils appear equal, EOM appear  intact ENT: normal nose; moist mucous membranes NECK: Supple, normal ROM CARD: RRR; S1 and S2 appreciated; no murmurs, no clicks, no rubs, no gallops RESP: Normal chest excursion without splinting or tachypnea; breath sounds clear and equal bilaterally; no wheezes, no rhonchi, no rales, no hypoxia or respiratory distress, speaking full sentences ABD/GI: Normal bowel sounds; non-distended; soft, only tender throughout the pelvic region, no tenderness at McBurney's point, no rebound, no guarding, no peritoneal signs, no hepatosplenomegaly GU:  Normal external genitalia. No lesions, rashes noted.  No sign of any burns.  Patient has small to moderate dark red vaginal bleeding on exam coming from the cervical os without clots. No appreciable vaginal discharge.  Patient does have some vaginal pain with insertion of the speculum.  She does have some adnexal tenderness bilaterally without mass or fullness, no cervical motion tenderness. Cervix is not appear friable.  Cervix is closed.  Chaperone present for exam. BACK:  The back appears normal EXT: Normal ROM in all joints; no deformity noted, no edema; no cyanosis SKIN: Normal color for age and race; warm; no rash on exposed skin NEURO: Moves all extremities equally PSYCH: The patient's mood and manner are appropriate.   MEDICAL DECISION MAKING: Patient here with lower pelvic abdominal pain, vaginal pain, intermittent bleeding since 05/10/2019.  Reportedly negative for STDs with screening by her primary care physician.  Finished course of antibiotics and medication for yeast infection per her report.  Requesting repeat STD screening today.  Was told she may need a pelvic ultrasound.  On exam, there are no signs of any external or internal burns.  There are no acute lesions.  She does have mild to moderate amount of dark red blood coming from the cervical os.  She does have adnexal tenderness without mass and no cervical motion tenderness or abnormal discharge.   Pelvic cultures pending.  Will obtain urinalysis and pregnancy test.  Also obtain transvaginal ultrasound.  Differential includes STD, torsion, TOA, UTI, ectopic.  Doubt PID given no discharge, fever, cervical motion tenderness on exam.  No signs of cervicitis today.  Will give ibuprofen for discomfort.  Doubt appendicitis.  She has no tenderness at McBurney's point.  ED PROGRESS: Wet prep is unremarkable.  Pregnancy test negative.  Urine shows no sign of infection today.  Transvaginal ultrasound pending.  Signed out to oncoming ED physician just to follow-up on ultrasound results.  If unremarkable, patient will be discharged home with follow-up with her PCP.  Instructed patient to avoid putting anything into her vagina for the next 2 weeks.  Recommended alternating Tylenol and Motrin for pain.  Will discharge on probiotics as I suspect at this point she has had change in the normal flora of her vagina secondary to being on antibiotics recently and due to her vaginal steaming procedure.   I reviewed all nursing notes and pertinent previous records as available.  I have interpreted any EKGs, lab and urine results,  imaging (as available).   Joanetta Humm was evaluated in Emergency Department on 05/27/2019 for the symptoms described in the history of present illness. She was evaluated in the context of the global COVID-19 pandemic, which necessitated consideration that the patient might be at risk for infection with the SARS-CoV-2 virus that causes COVID-19. Institutional protocols and algorithms that pertain to the evaluation of patients at risk for COVID-19 are in a state of rapid change based on information released by regulatory bodies including the CDC and federal and state organizations. These policies and algorithms were followed during the patient's care in the ED.  Patient was seen wearing N95, face shield, gloves.    Ajeenah Heiny, Delice Bison, DO 05/27/19 [phone]

## 2019-05-27 NOTE — Discharge Instructions (Addendum)
You may alternate Tylenol 1000 mg every 6 hours as needed for pain and Ibuprofen 800 mg every 8 hours as needed for pain.  Please take Ibuprofen with food.  Do not take more than 4000 mg of Tylenol (acetaminophen) in a 24 hour period.  Your urine showed no sign of infection.  Your pregnancy test was negative.  Your pelvic swabs did not show trichomonas.  Your gonorrhea and Chlamydia tests are pending.  If these are positive, you will be contacted.  I recommend starting a probiotic for the next 30 days.  I recommend that you do not put anything into your vagina including tampons for the next 2 weeks.  I recommend close follow up with your PCP.    The ultrasound did reveal 2 small fibroids which you can follow-up with your OB/GYN about.  No other concerning findings seen on the ultrasound.  [facility]:  [facility] at [place], [place] [phone]  [facility] [place] [facility] [place], [place] [phone]  [place]  # [place], [place] [phone]   [place] [address] [place], [place] [phone]  [facility] [address] [place], [place] [phone]   [facility] [place] # [place], [place] [phone]   [facility] [address] [place], [place] [phone]   [facility] [address] [place], [place] [phone]   [facility] [address] [place], [place] [phone]

## 2019-05-27 NOTE — ED Provider Notes (Signed)
7:45 AM Care assumed from Dr. Leonides Schanz.  At time of transfer care, patient is awaiting results of transvaginal ultrasound to look for significant abnormalities contributing to some vaginal bleeding and discomfort.  Plan of care is to discharge patient follow-up with OB/GYN team if there are no significant abnormalities.  8:09 AM Ultrasound urine showing 2 small fibroids but otherwise reassuring.  No abscesses or large abnormality seen.  Plan of care to discharge patient.  Patient was informed of these findings and will follow up with OB/GYN team and PCP.  She agreed with plan of care and was discharged in good condition.   Clinical Impression: 1. Pelvic pain in female   2. Intramural leiomyoma of uterus     Disposition: Discharge  Condition: Good  I have discussed the results, Dx and Tx plan with the pt(& family if present). He/she/they expressed understanding and agree(s) with the plan. Discharge instructions discussed at great length. Strict return precautions discussed and pt &/or family have verbalized understanding of the instructions. No further questions at time of discharge.    Current Discharge Medication List    START taking these medications   Details  !! Probiotic CAPS Take 1 capsule by mouth daily. Qty: 30 capsule, Refills: 1     !! - Potential duplicate medications found. Please discuss with provider.      Follow Up: No follow-up provider specified.       Mekenzie Modeste, Gwenyth Allegra, MD 05/27/19 [phone]

## 2019-05-27 NOTE — ED Triage Notes (Signed)
Patient arrived stating about a month ago she went to a vaginal steamer, reports that she has had irregular vaginal bleeding and occasional pain since. States she was seen by her primary care provider and given antibiotics for a possible infection but had no relief.

## 2019-05-28 LAB — GC/CHLAMYDIA PROBE AMP (~~LOC~~) NOT AT ARMC
Chlamydia: NEGATIVE
Neisseria Gonorrhea: NEGATIVE

## 2019-11-15 IMAGING — CT CT ABD-PELV W/ CM
2 of 4 series · 14 of 46 positions shown, 16 images · IV contrast (omnipaque)
Comparison: Abdominal radiograph, 06/05/2018

CLINICAL DATA: Patient is having generalized abdominal pain with
rectal bleeding;History of constipation, used hydroperoxide for an
enema per patient "burned the inside of her colon"

EXAM:
CT ABDOMEN AND PELVIS WITH CONTRAST
TECHNIQUE: Multidetector CT imaging of the abdomen and pelvis was performed
using the standard protocol following bolus administration of
intravenous contrast.
CONTRAST:  100mL OMNIPAQUE IOHEXOL 300 MG/ML SOLN, 30mL OMNIPAQUE
IOHEXOL 300 MG/ML SOLN

[Series 2: axial st · axial · 0.64mm/px · z∈[-566,-136]mm · 11 of 98 slices shown, 13 images]
[im 6/98  soft-tissue]
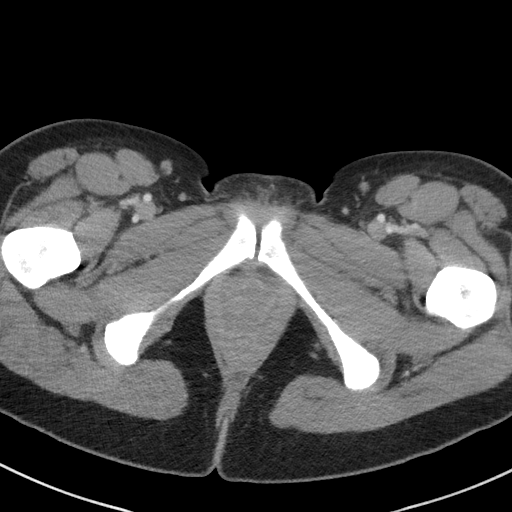
[im 6/98  bone]
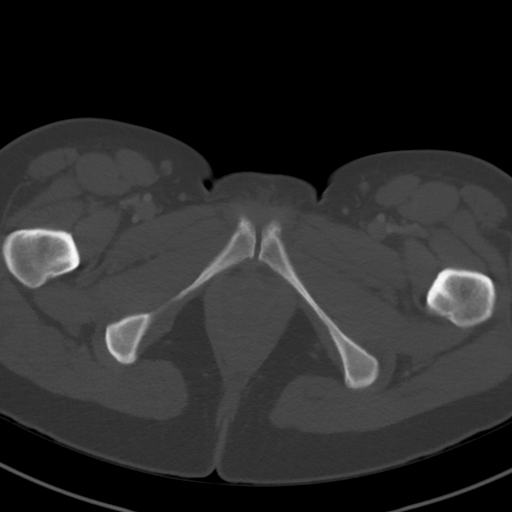
[im 16/98  soft-tissue]
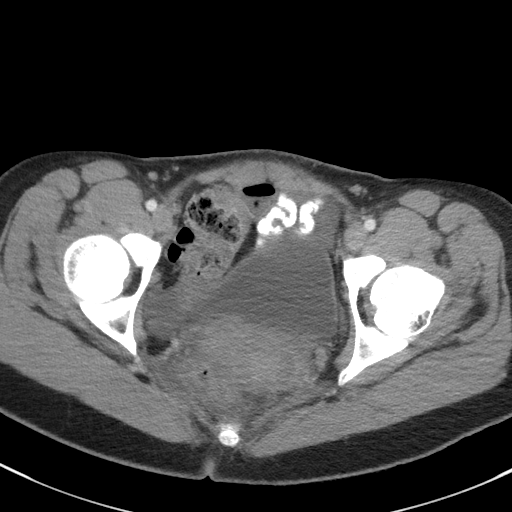
[im 26/98  soft-tissue]
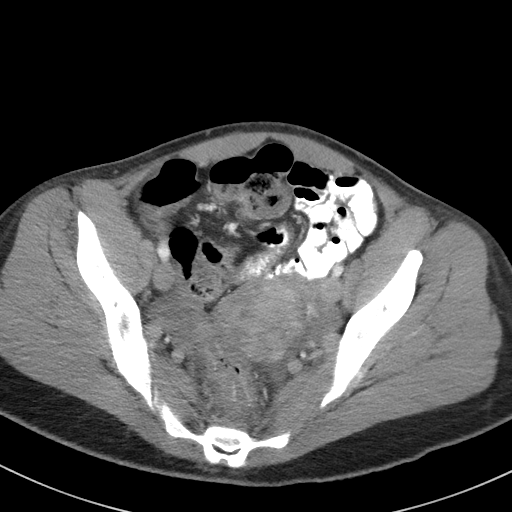
[im 31/98  soft-tissue]
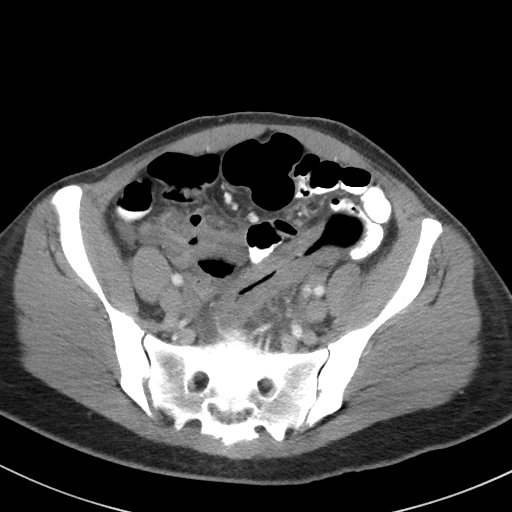
[im 41/98  soft-tissue]
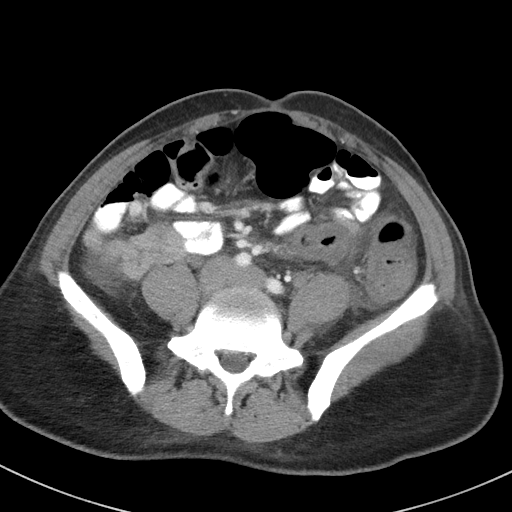
[im 52/98  soft-tissue]
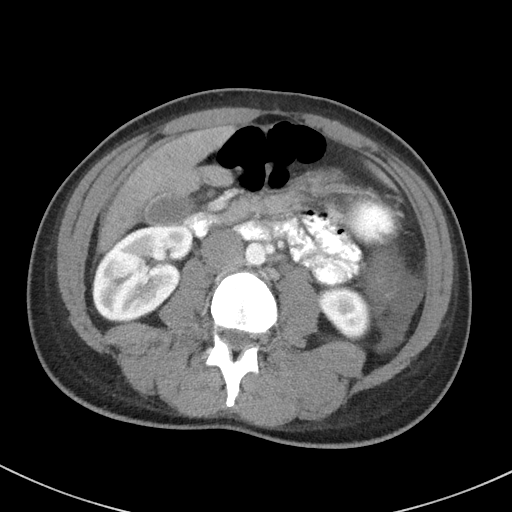
[im 57/98  soft-tissue]
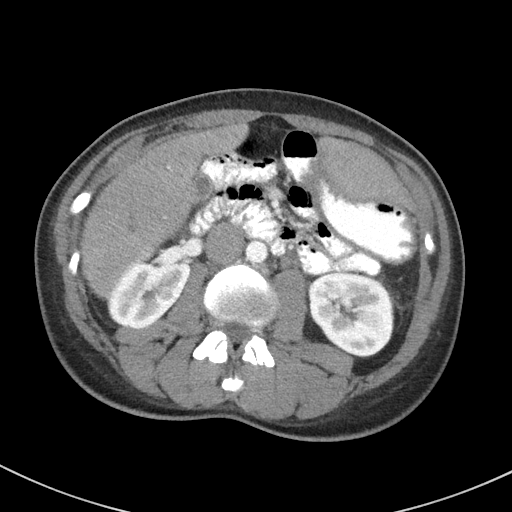
[im 67/98  soft-tissue]
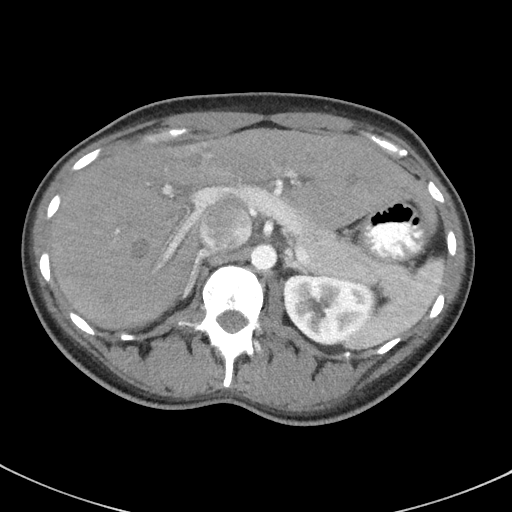
[im 72/98  soft-tissue]
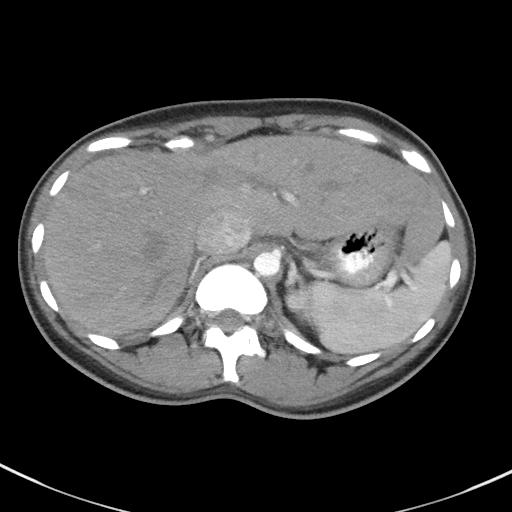
[im 72/98  bone]
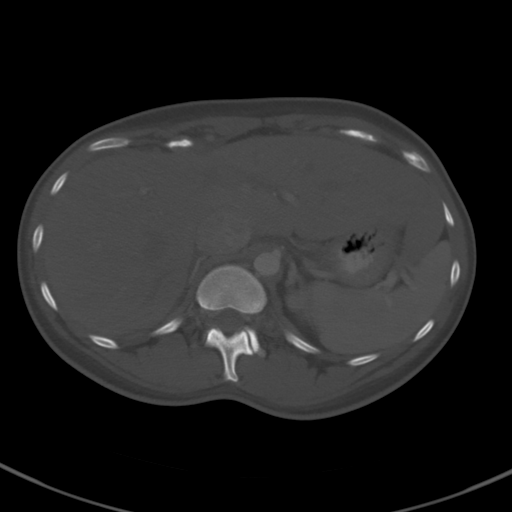
[im 82/98  soft-tissue]
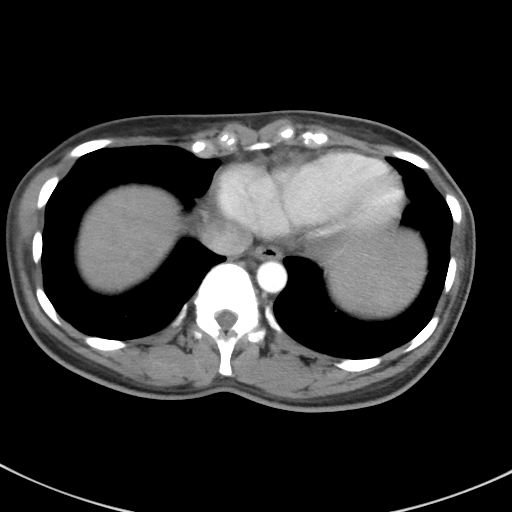
[im 92/98  soft-tissue]
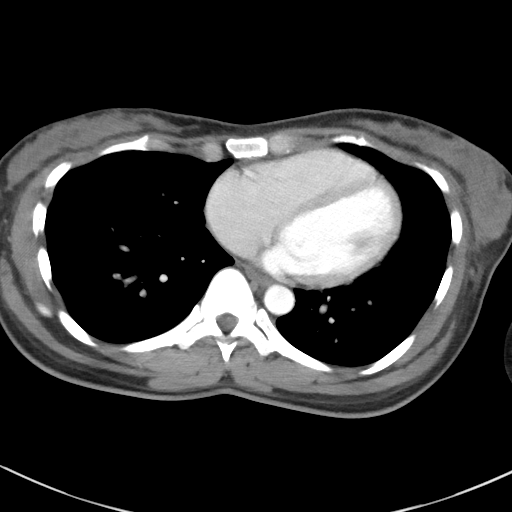

[Series 5: coronal st · coronal · 0.59mm/px · 3 of 71 slices shown]
[im 24/71  soft-tissue]
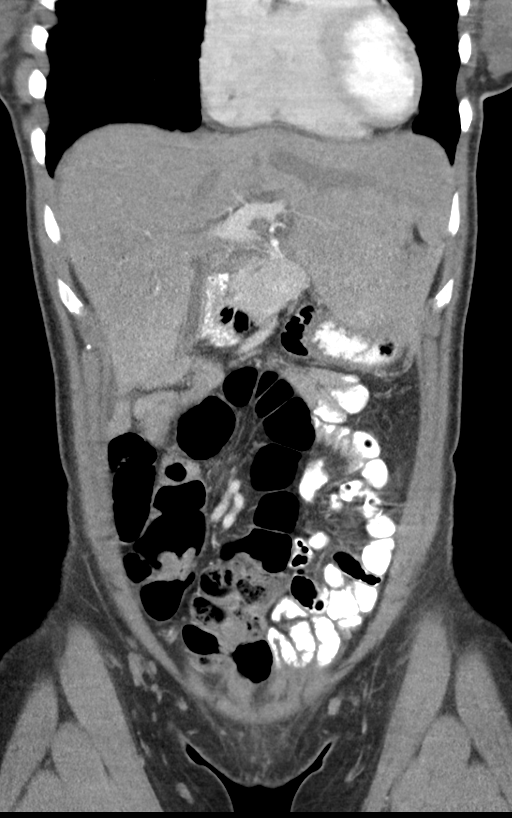
[im 32/71  soft-tissue]
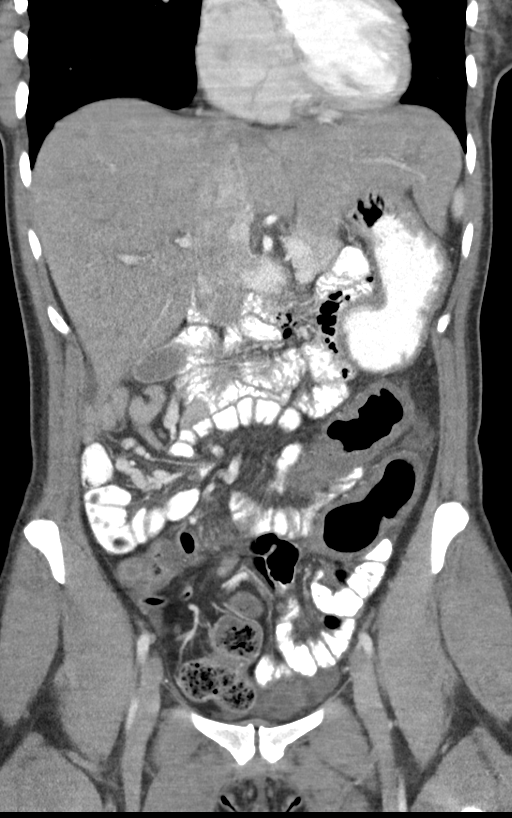
[im 39/71  soft-tissue]
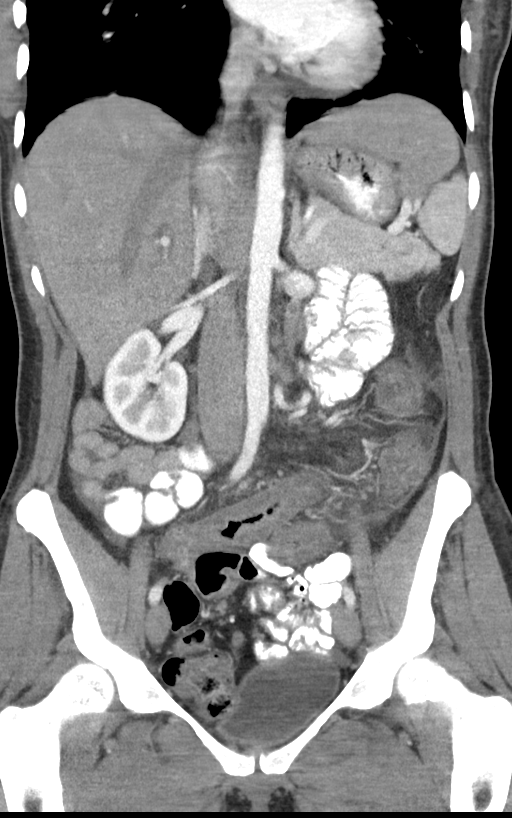

[14 of 46 positions shown; findings below may reference images not displayed]

FINDINGS: Lower chest: Clear lung bases.  Heart normal in size.

Hepatobiliary: No focal liver abnormality is seen. No gallstones,
gallbladder wall thickening, or biliary dilatation.

Pancreas: Unremarkable. No pancreatic ductal dilatation or
surrounding inflammatory changes.

Spleen: Normal in size without focal abnormality.

Adrenals/Urinary Tract: Adrenal glands are unremarkable. Kidneys are
normal, without renal calculi, focal lesion, or hydronephrosis.
Bladder is unremarkable.

Stomach/Bowel: There is wall thickening, up to 9 mm, from the rectum
through the sigmoid and descending colon and across the transverse
colon. Transverse colon lies low in the abdomen. Colonic wall
thickening terminates in the right transverse colon, which lies in
the right lower quadrant. Colon proximal to this is redundant,
extending into the right upper abdomen then descending inferiorly
into the pelvis. No other colonic wall thickening or inflammation.
Normal appendix visualized.

Stomach and small bowel are unremarkable.

There is no abscess. There is no extraluminal air to suggest a
perforation.

Vascular/Lymphatic: No significant vascular findings are present. No
enlarged abdominal or pelvic lymph nodes.

Reproductive: Uterus and bilateral adnexa are unremarkable.

Other: Trace amount of ascites is noted adjacent to the inflamed
colon and along the inferior margin of the liver as well as
collecting in the posterior pelvic recess.

Musculoskeletal: No fracture or acute finding. No osteoblastic or
osteolytic lesions.
IMPRESSION: 1. Extensive infectious or inflammatory colitis extending from the
right transverse colon to the rectum. There is no evidence of
colonic perforation and no abscess. There is a trace amount of
associated ascites.
2. No other acute abnormality within the abdomen or pelvis.

## 2020-11-03 IMAGING — US US PELVIS COMPLETE
1 series · 13 of 25 positions shown · non-contrast
Comparison: Abdominal CT 06/07/2018

CLINICAL DATA: Lower abdominal pain and intermittent bleeding

EXAM:
TRANSABDOMINAL AND TRANSVAGINAL ULTRASOUND OF PELVIS
DOPPLER ULTRASOUND OF OVARIES
TECHNIQUE: Both transabdominal and transvaginal ultrasound examinations of the
pelvis were performed. Transabdominal technique was performed for
global imaging of the pelvis including uterus, ovaries, adnexal
regions, and pelvic cul-de-sac.
It was necessary to proceed with endovaginal exam following the
transabdominal exam to visualize the endometrium and over. Color and
duplex Doppler ultrasound was utilized to evaluate blood flow to the
ovaries.

[Series 1: us pelvis complete · 13 of 63 slices shown]
[im 1/63]
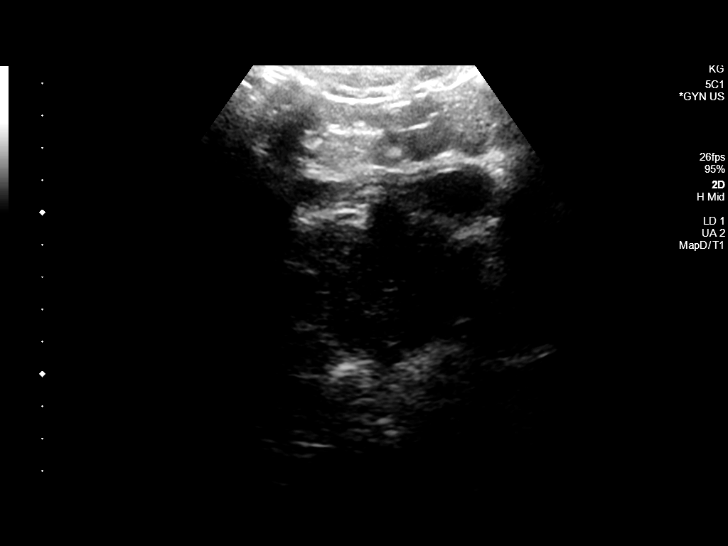
[im 6/63]
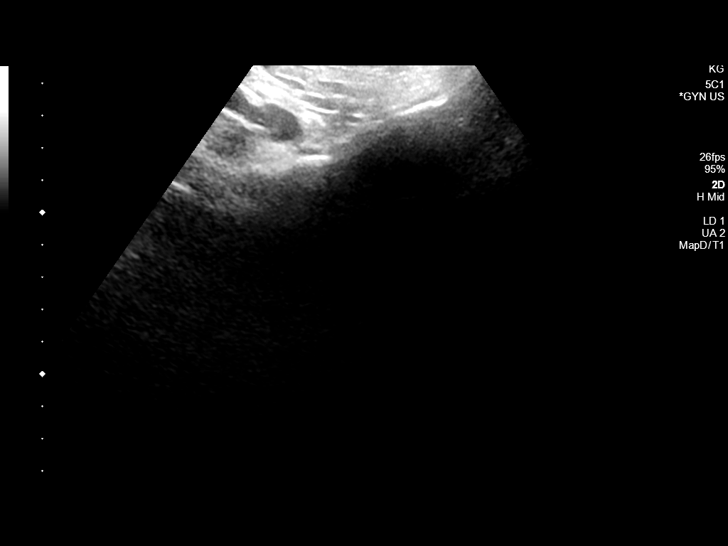
[im 11/63]
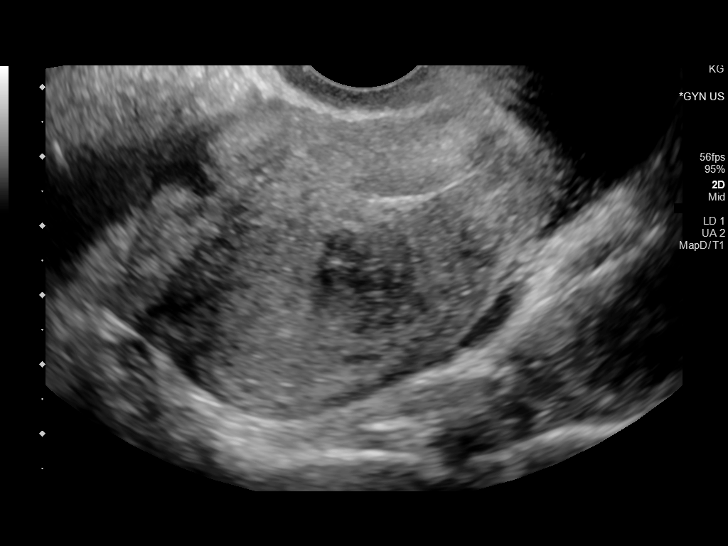
[im 16/63]
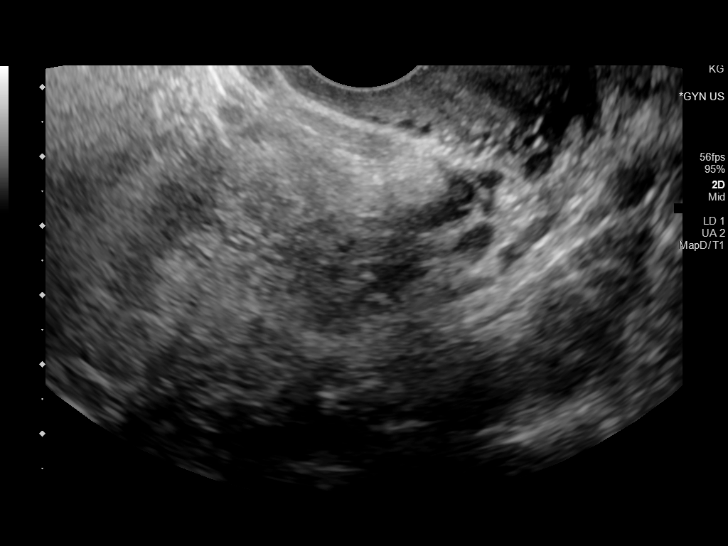
[im 21/63]
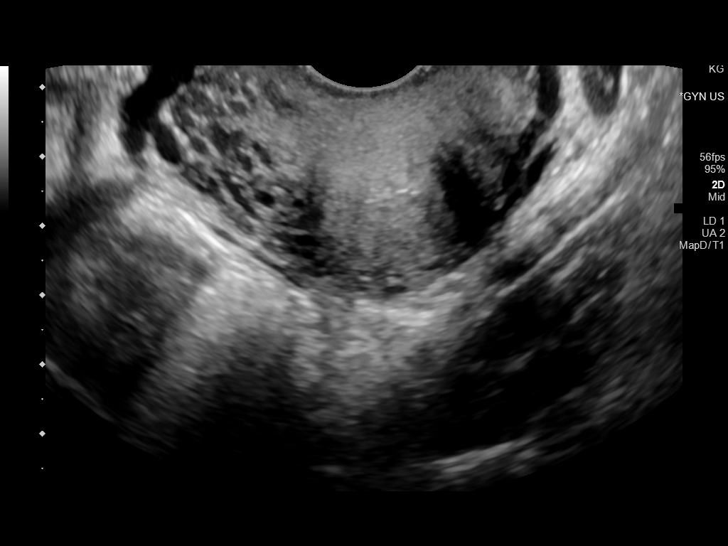
[im 26/63]
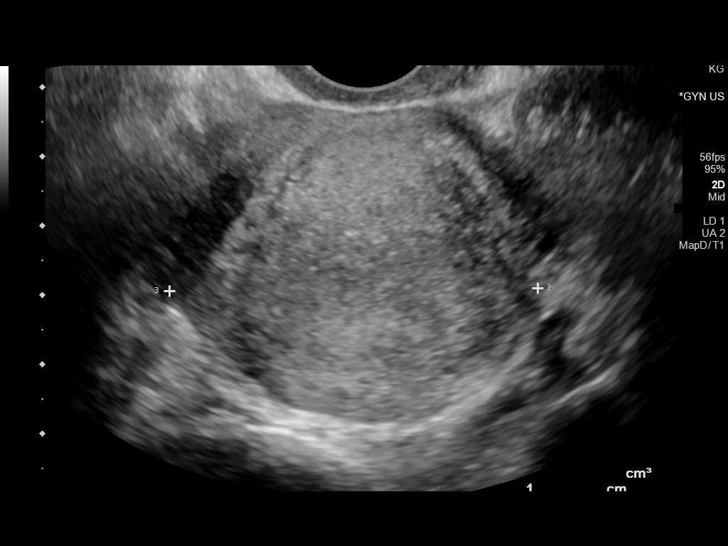
[im 32/63]
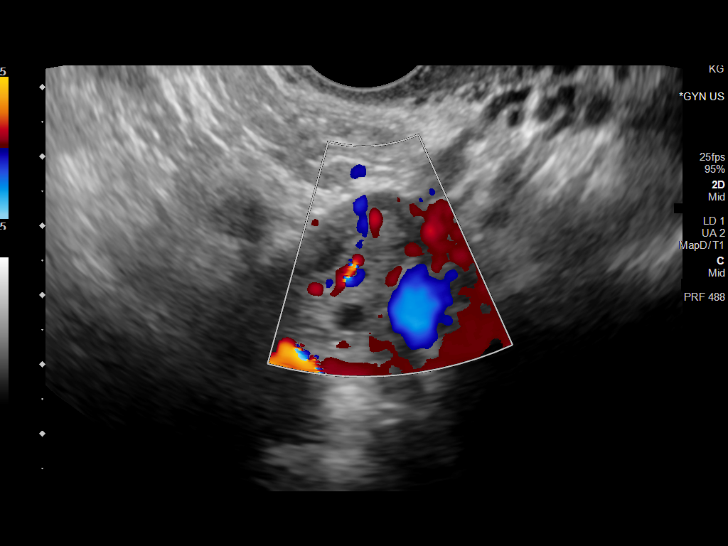
[im 37/63]
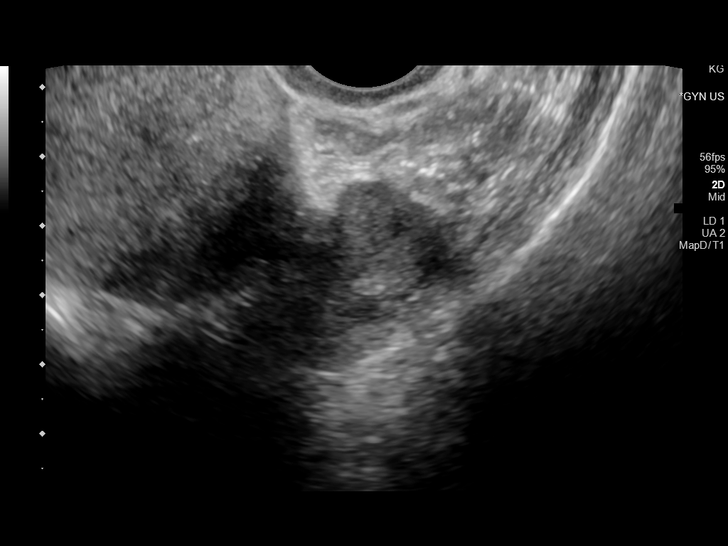
[im 42/63]
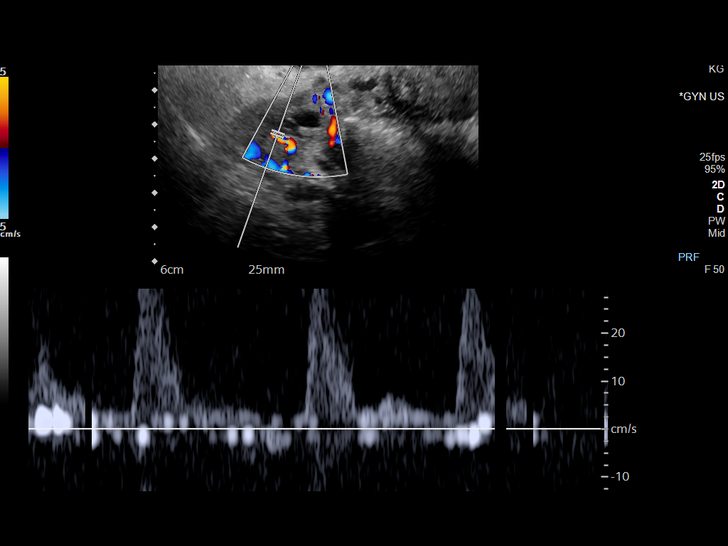
[im 47/63]
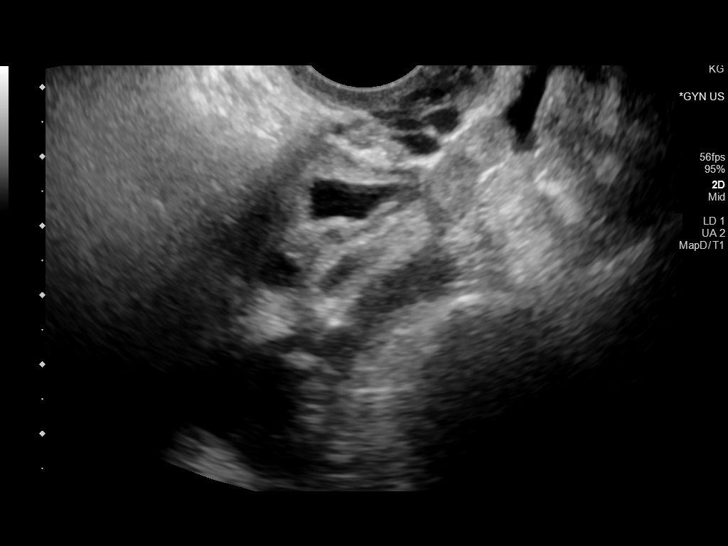
[im 52/63]
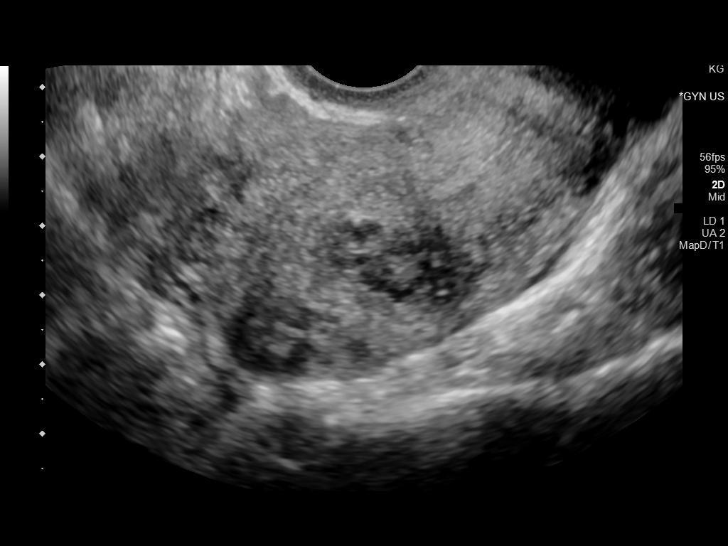
[im 57/63]
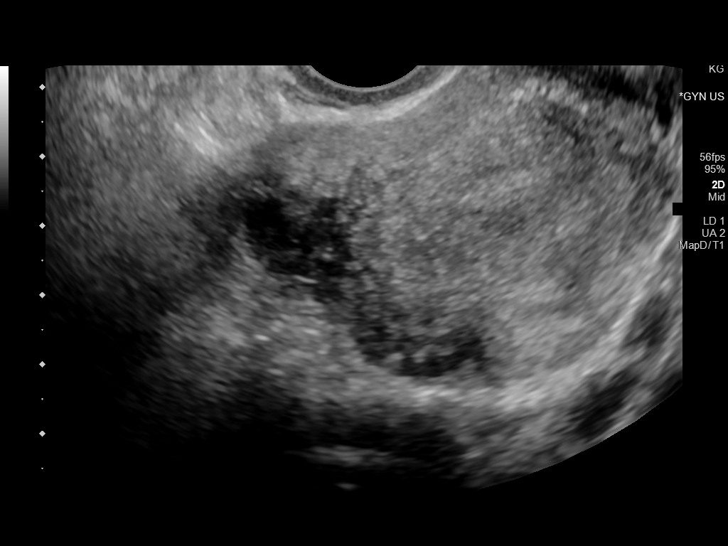
[im 63/63]
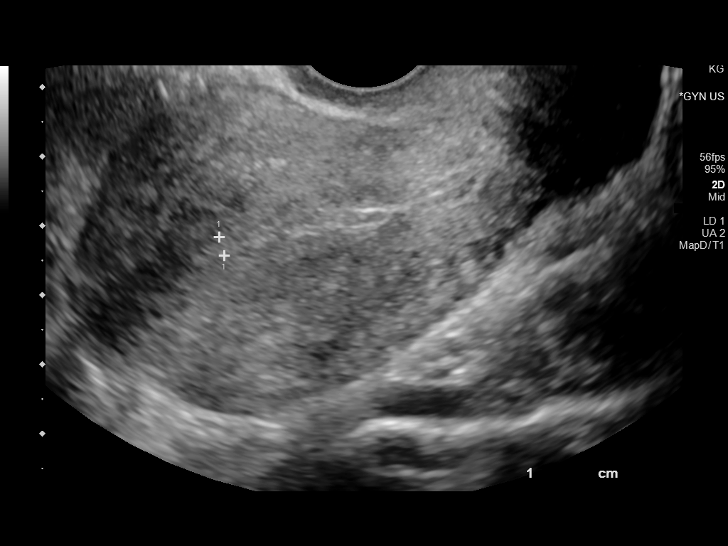

[13 of 25 positions shown; findings below may reference images not displayed]

FINDINGS: Uterus

Measurements: 8.5 x 4.6 x 5 cm = volume: 110 mL. Two small
heterogeneous and hypoechoic masses measuring 23 and 16 mm,
consistent with intramural fibroids.

Endometrium

Thickness: 6 mm.  No focal abnormality visualized.

Right ovary

Measurements: 28 x 24 x 28 mm normal appearance/no adnexal mass.

Left ovary

Measurements: 23 x 26 x 1.9 mm normal appearance/no adnexal mass.

Pulsed Doppler evaluation of both ovaries demonstrates arterial and
venous waveforms.

Other findings

No abnormal free fluid.
IMPRESSION: 1. No acute finding or explanation for bleeding.
2. 2 small intramural fibroids.

## 2020-11-03 IMAGING — US US ART/VEN ABD/PELV/SCROTUM DOPPLER LTD
1 series · 13 of 25 positions shown · non-contrast
Comparison: Abdominal CT 06/07/2018

CLINICAL DATA: Lower abdominal pain and intermittent bleeding

EXAM:
TRANSABDOMINAL AND TRANSVAGINAL ULTRASOUND OF PELVIS
DOPPLER ULTRASOUND OF OVARIES
TECHNIQUE: Both transabdominal and transvaginal ultrasound examinations of the
pelvis were performed. Transabdominal technique was performed for
global imaging of the pelvis including uterus, ovaries, adnexal
regions, and pelvic cul-de-sac.
It was necessary to proceed with endovaginal exam following the
transabdominal exam to visualize the endometrium and over. Color and
duplex Doppler ultrasound was utilized to evaluate blood flow to the
ovaries.

[Series 1: us art/ven abd/pelv/scrotum doppler ltd · 13 of 63 slices shown]
[im 1/63]
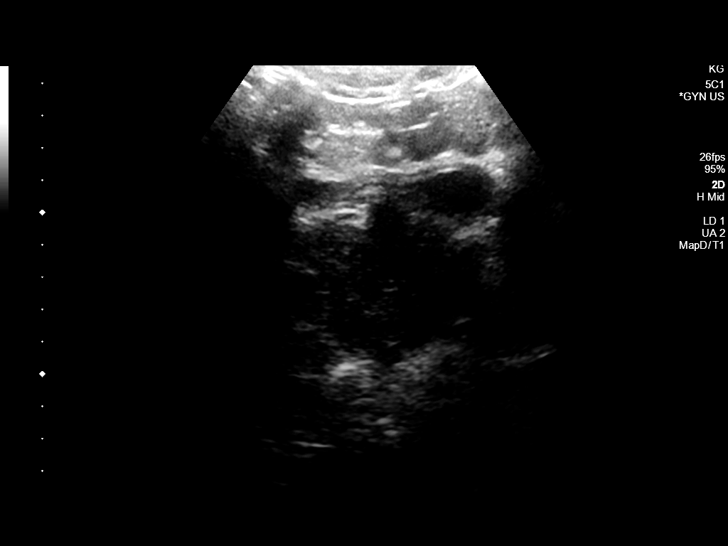
[im 6/63]
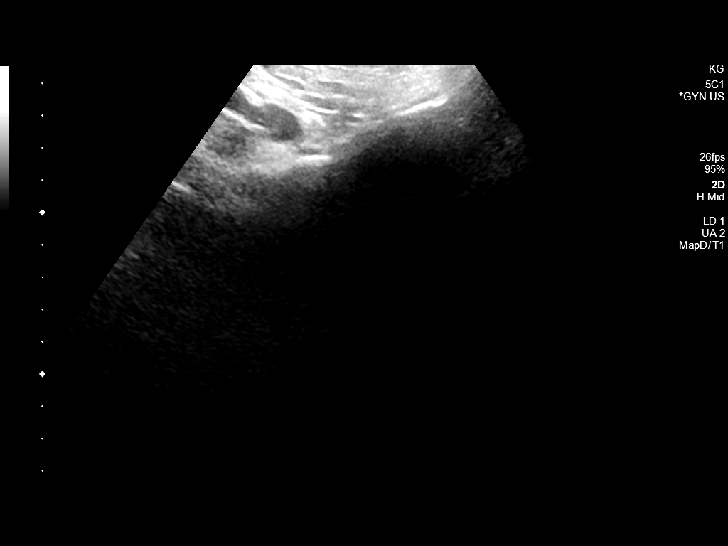
[im 11/63]
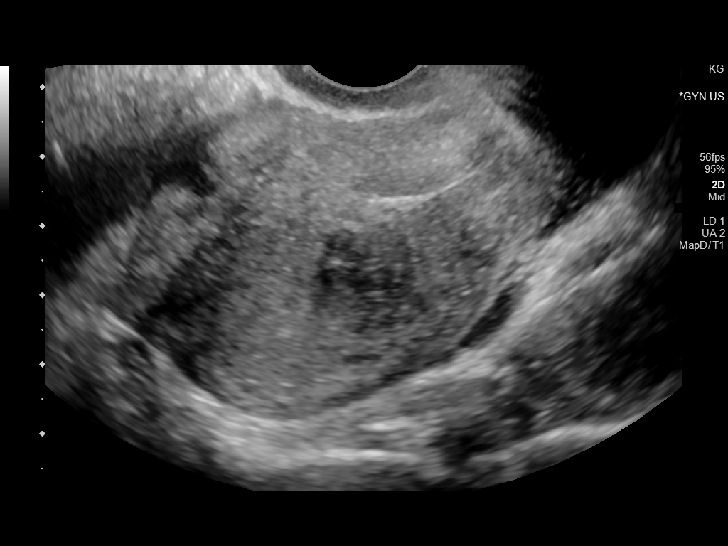
[im 16/63]
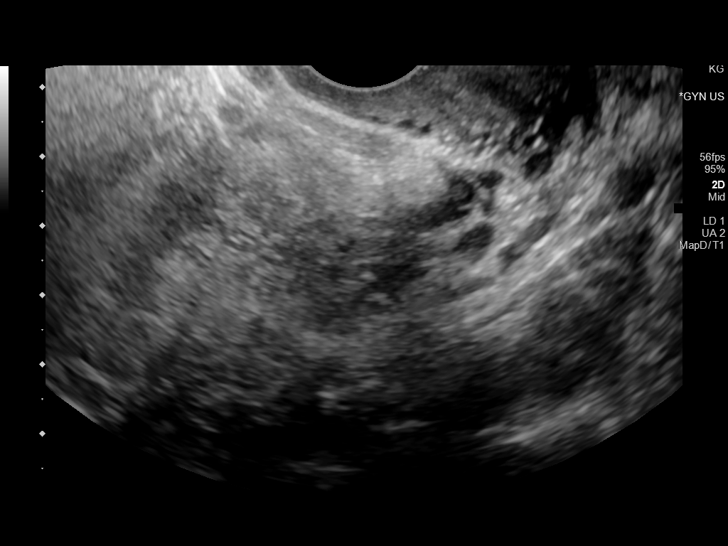
[im 21/63]
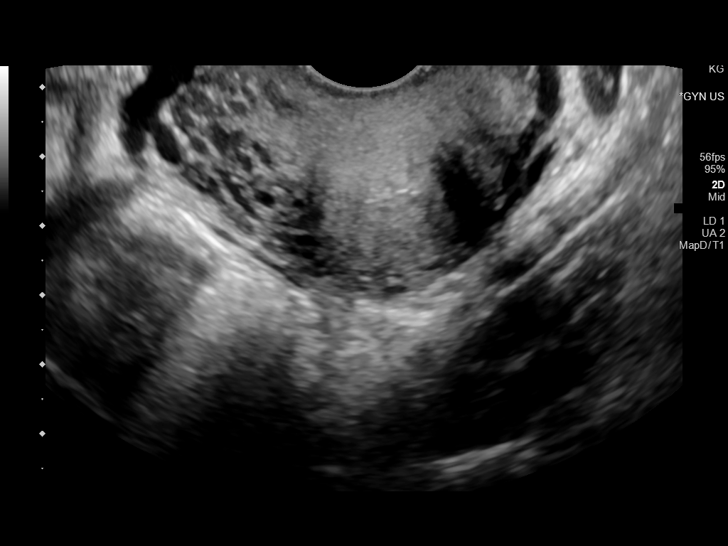
[im 26/63]
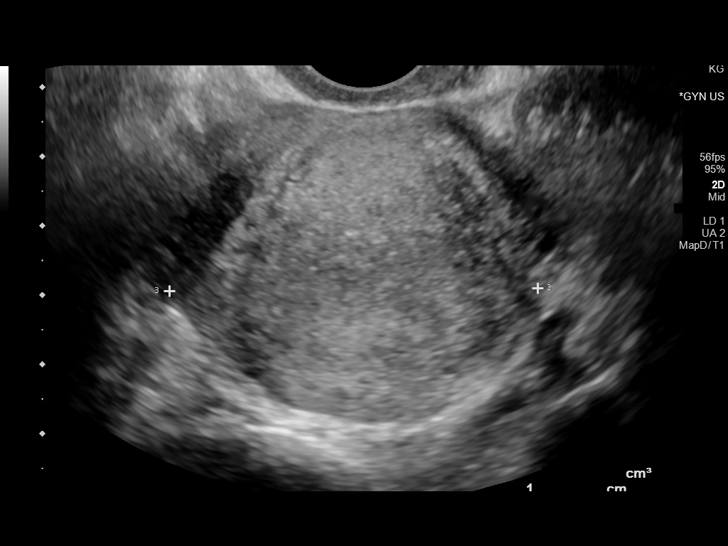
[im 32/63]
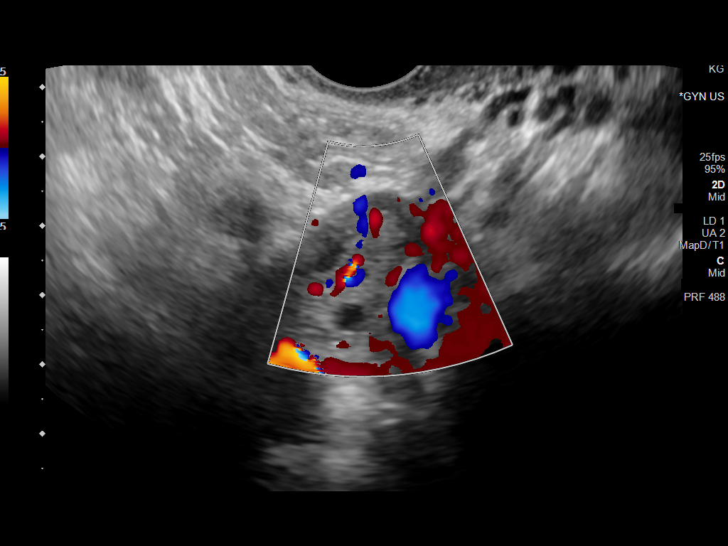
[im 37/63]
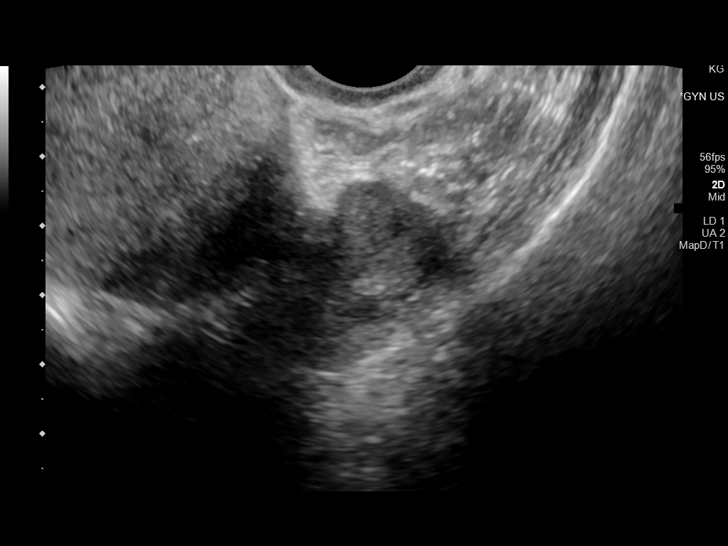
[im 42/63]
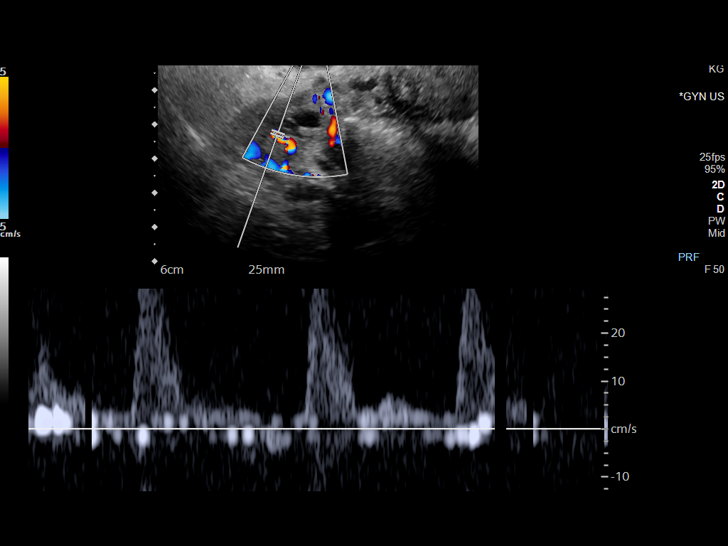
[im 47/63]
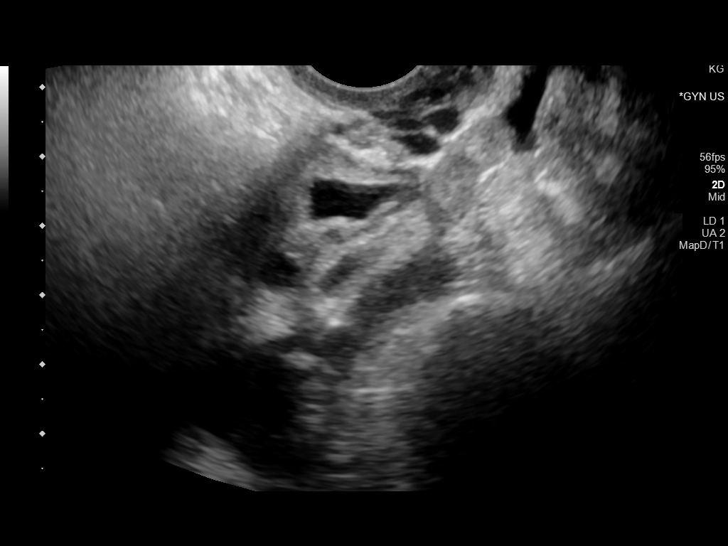
[im 52/63]
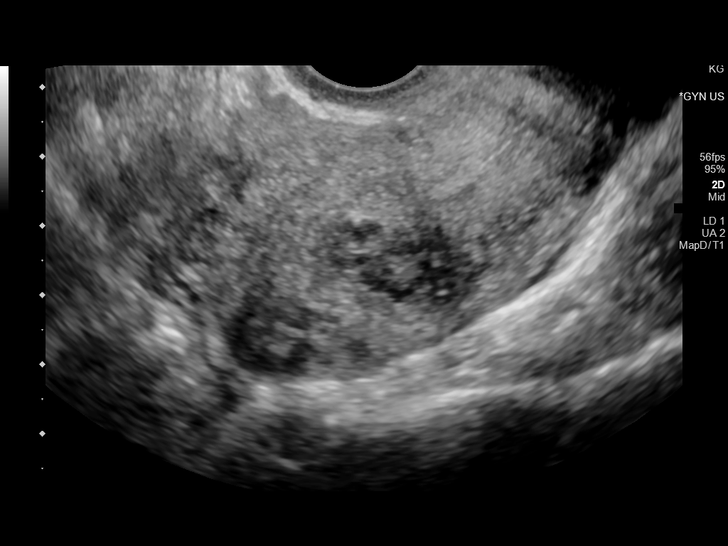
[im 57/63]
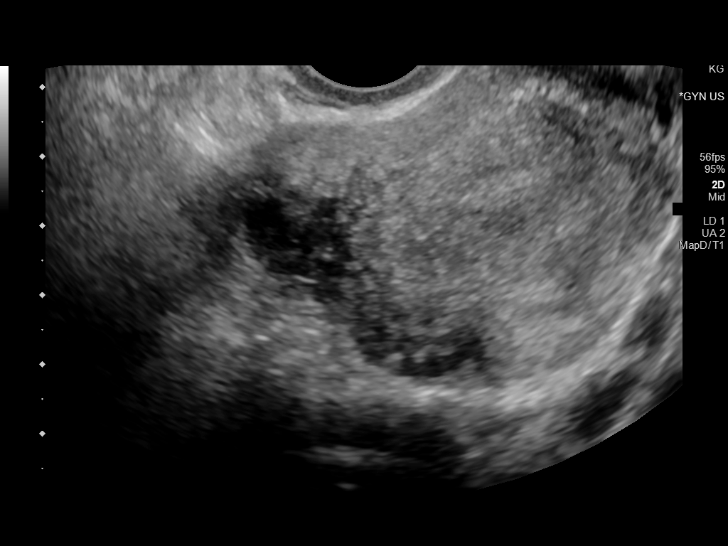
[im 63/63]
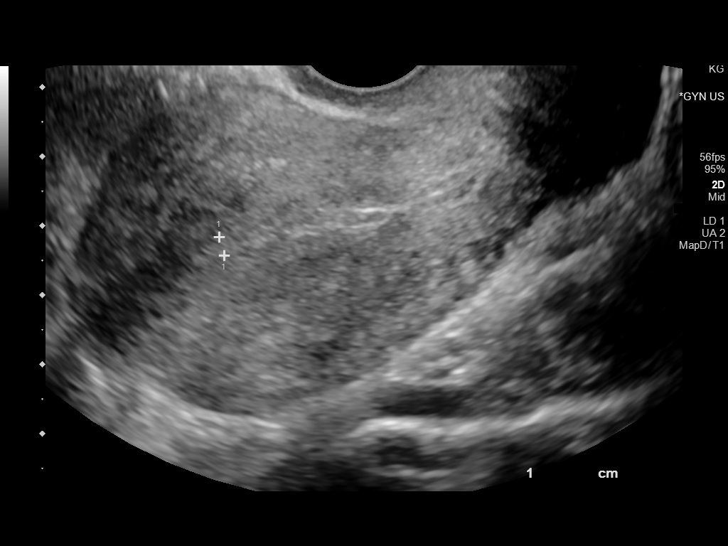

[13 of 25 positions shown; findings below may reference images not displayed]

FINDINGS: Uterus

Measurements: 8.5 x 4.6 x 5 cm = volume: 110 mL. Two small
heterogeneous and hypoechoic masses measuring 23 and 16 mm,
consistent with intramural fibroids.

Endometrium

Thickness: 6 mm.  No focal abnormality visualized.

Right ovary

Measurements: 28 x 24 x 28 mm normal appearance/no adnexal mass.

Left ovary

Measurements: 23 x 26 x 1.9 mm normal appearance/no adnexal mass.

Pulsed Doppler evaluation of both ovaries demonstrates arterial and
venous waveforms.

Other findings

No abnormal free fluid.
IMPRESSION: 1. No acute finding or explanation for bleeding.
2. 2 small intramural fibroids.

## 2024-05-09 ENCOUNTER — Inpatient Hospital Stay

## 2024-05-09 ENCOUNTER — Inpatient Hospital Stay: Admitting: Oncology

## 2024-05-09 VITALS — BP 118/75 | HR 78 | Temp 98.0°F | Resp 18 | Ht 68.0 in | Wt 139.8 lb

## 2024-05-09 DIAGNOSIS — D500 Iron deficiency anemia secondary to blood loss (chronic): Secondary | ICD-10-CM

## 2024-05-09 LAB — CBC WITH DIFFERENTIAL (CANCER CENTER ONLY)
Abs Immature Granulocytes: 0 10*3/uL (ref 0.00–0.07)
Basophils Absolute: 0.1 10*3/uL (ref 0.0–0.1)
Basophils Relative: 2 %
Eosinophils Absolute: 0.1 10*3/uL (ref 0.0–0.5)
Eosinophils Relative: 2 %
HCT: 36.8 % (ref 36.0–46.0)
Hemoglobin: 12 g/dL (ref 12.0–15.0)
Immature Granulocytes: 0 %
Lymphocytes Relative: 34 %
Lymphs Abs: 1.1 10*3/uL (ref 0.7–4.0)
MCH: 26.6 pg (ref 26.0–34.0)
MCHC: 32.6 g/dL (ref 30.0–36.0)
MCV: 81.6 fL (ref 80.0–100.0)
Monocytes Absolute: 0.3 10*3/uL (ref 0.1–1.0)
Monocytes Relative: 9 %
Neutro Abs: 1.6 10*3/uL — ABNORMAL LOW (ref 1.7–7.7)
Neutrophils Relative %: 53 %
Platelet Count: 220 10*3/uL (ref 150–400)
RBC: 4.51 MIL/uL (ref 3.87–5.11)
RDW: 17.1 % — ABNORMAL HIGH (ref 11.5–15.5)
WBC Count: 3.1 10*3/uL — ABNORMAL LOW (ref 4.0–10.5)
nRBC: 0 % (ref 0.0–0.2)

## 2024-05-09 LAB — FERRITIN: Ferritin: 22 ng/mL (ref 11–307)

## 2024-05-09 LAB — IRON AND IRON BINDING CAPACITY (CC-WL,HP ONLY)
Iron: 52 ug/dL (ref 28–170)
Saturation Ratios: 12 % (ref 10.4–31.8)
TIBC: 427 ug/dL (ref 250–450)
UIBC: 375 ug/dL

## 2024-05-09 NOTE — Progress Notes (Unsigned)
 "  [facility]  HEMATOLOGY CLINIC CONSULTATION NOTE   PATIENT NAME: Bailey Martin   MR#: [id number] DOB: 1985/10/14  DATE OF SERVICE: 05/09/2024  Patient Care Team: Aldo Pellet, FNP as PCP - General (Nurse Practitioner)  REASON FOR CONSULTATION/ CHIEF COMPLAINT:  Evaluation of anemia.  ASSESSMENT & PLAN:   Bailey Martin is a 39 y.o. lady with a past medical history of dyslipidemia, prediabetes, anxiety/depression, was referred to our service for evaluation of iron deficiency anemia.    No problem-specific Assessment & Plan notes found for this encounter.   Assessment and Plan Assessment & Plan       Since the cause of anemia seems to be obvious from iron deficiency, I am not pursuing extensive workup at this time.  If inadequate response to iron replacement is noted, we will pursue workup to rule out other etiologies.  I reviewed lab results and outside records for this visit and discussed relevant results with the patient. Diagnosis, plan of care and treatment options were also discussed in detail with the patient. Opportunity provided to ask questions and answers provided to her apparent satisfaction. Provided instructions to call our clinic with any problems, questions or concerns prior to return visit. I recommended to continue follow-up with PCP and sub-specialists. She verbalized understanding and agreed with the plan. No barriers to learning was detected.  Bailey Moscoso, MD [facility] - A DEPT OF [name] Dept: [phone] Dept Fax: [phone]  05/09/2024 9:44 AM  HISTORY OF PRESENT ILLNESS:  Discussed the use of AI scribe software for clinical note transcription with the patient, who gave verbal consent to proceed.  History of Present Illness     On 04/20/2024, labs at her PCPs office showed hemoglobin of 11.8, hematocrit  37.7, MCV 85.  White count was 3100 with normal differential, platelet count of 326,000.  Ferritin was decreased at 10.  Vitamin B12 and folate were normal.  Referral was sent to us  for further evaluation and management of iron deficiency anemia.  Previously labs in September 2025 showed hemoglobin of 10.5.   MEDICAL HISTORY:  Past Medical History:  Diagnosis Date   GERD (gastroesophageal reflux disease)    Trichomonal vaginitis 2014   Vaginal Pap smear, abnormal     SURGICAL HISTORY: Past Surgical History:  Procedure Laterality Date   HERNIA REPAIR     WISDOM TOOTH EXTRACTION      SOCIAL HISTORY: She reports that she has quit smoking. She has never used smokeless tobacco. She reports current alcohol use. She reports that she does not use drugs. Social History   Socioeconomic History   Marital status: Single    Spouse name: Not on file   Number of children: Not on file   Years of education: Not on file   Highest education level: Not on file  Occupational History   Not on file  Tobacco Use   Smoking status: Former   Smokeless tobacco: Never  Substance and Sexual Activity   Alcohol use: Yes    Comment: occassional when not pregnant   Drug use: No   Sexual activity: Yes    Birth control/protection: None  Other Topics Concern   Not on file  Social History Narrative   Not on file   Social Drivers of Health   Tobacco Use: Not on file  Financial Resource Strain: Not on file  Food Insecurity: Food  Insecurity Present (05/09/2024)   Epic    Worried About Programme Researcher, Broadcasting/film/video in the Last Year: Sometimes true    [employer] of Food in the Last Year: Sometimes true  Transportation Needs: No Transportation Needs (05/09/2024)   Epic    Lack of Transportation (Medical): No    Lack of Transportation (Non-Medical): No  Physical Activity: Not on file  Stress: Not on file  Social Connections: Not on file  Intimate Partner Violence: Not At Risk (05/09/2024)   Epic    Fear of Current or  Ex-Partner: No    Emotionally Abused: No    Physically Abused: No    Sexually Abused: No  Depression (PHQ2-9): Low Risk (05/09/2024)   Depression (PHQ2-9)    PHQ-2 Score: 0  Alcohol Screen: Not on file  Housing: Low Risk (05/09/2024)   Epic    Unable to Pay for Housing in the Last Year: No    Number of Times Moved in the Last Year: 0    Homeless in the Last Year: No  Utilities: Not At Risk (05/09/2024)   Epic    Threatened with loss of utilities: No  Health Literacy: Not on file    FAMILY HISTORY: Family History  Problem Relation Age of Onset   Diabetes Maternal Grandmother     ALLERGIES:  She is allergic to penicillins, tylenol  [acetaminophen ], and latex.  MEDICATIONS:  Current Outpatient Medications  Medication Sig Dispense Refill   amphetamine -dextroamphetamine  (ADDERALL) 15 MG tablet Take 1 tablet by mouth 2 (two) times daily.     No current facility-administered medications for this visit.    REVIEW OF SYSTEMS:    Review of Systems - Oncology  All other pertinent systems were reviewed and were negative except as mentioned above.  PHYSICAL EXAMINATION:   Onc Performance Status - 05/09/24 0928       KPS SCALE   KPS % SCORE Normal activity with effort, some s/s of disease          Vitals:   05/09/24 0911  BP: 118/75  Pulse: 78  Resp: 18  Temp: 98 F (36.7 C)  SpO2: 100%   Filed Weights   05/09/24 0911  Weight: 139 lb 12.8 oz (63.4 kg)    Physical Exam Constitutional:      General: She is not in acute distress.    Appearance: Normal appearance.  HENT:     Head: Normocephalic and atraumatic.  Cardiovascular:     Rate and Rhythm: Normal rate.  Pulmonary:     Effort: Pulmonary effort is normal. No respiratory distress.  Abdominal:     General: There is no distension.  Neurological:     General: No focal deficit present.     Mental Status: She is alert and oriented to person, place, and time.  Psychiatric:        Mood and Affect: Mood normal.         Behavior: Behavior normal.     LABORATORY DATA:   I have reviewed the data as listed.  No results found for any visits on 05/09/24.   RADIOGRAPHIC STUDIES:  No recent pertinent imaging studies available to review.  Orders Placed This Encounter  Procedures   CBC with Differential (Cancer Center Only)    Standing Status:   Future    Expiration Date:   05/09/2025   Iron and Iron Binding Capacity ([facility] only)    Standing Status:   Future    Expiration Date:   05/09/2025  Ferritin    Standing Status:   Future    Expiration Date:   05/09/2025    No future appointments.    I spent a total of *** minutes during this encounter with the patient including review of chart and various tests results, discussions about plan of care and coordination of care plan.  This document was completed utilizing speech recognition software. Grammatical errors, random word insertions, pronoun errors, and incomplete sentences are an occasional consequence of this system due to software limitations, ambient noise, and hardware issues. Any formal questions or concerns about the content, text or information contained within the body of this dictation should be directly addressed to the provider for clarification.  "

## 2024-09-06 ENCOUNTER — Inpatient Hospital Stay: Admitting: Oncology

## 2024-09-06 ENCOUNTER — Inpatient Hospital Stay
